# Patient Record
Sex: Female | Born: 1980 | Race: White | Hispanic: No | Marital: Married | Smoking: Never smoker
Health system: Southern US, Community
[De-identification: ages and names within clinical notes are randomized; demographics above are authoritative.]

## PROBLEM LIST (undated history)

## (undated) DIAGNOSIS — K219 Gastro-esophageal reflux disease without esophagitis: Secondary | ICD-10-CM

## (undated) DIAGNOSIS — I7300 Raynaud's syndrome without gangrene: Secondary | ICD-10-CM

## (undated) DIAGNOSIS — G901 Familial dysautonomia [Riley-Day]: Secondary | ICD-10-CM

## (undated) DIAGNOSIS — R9431 Abnormal electrocardiogram [ECG] [EKG]: Secondary | ICD-10-CM

## (undated) DIAGNOSIS — I493 Ventricular premature depolarization: Secondary | ICD-10-CM

## (undated) DIAGNOSIS — R000 Tachycardia, unspecified: Secondary | ICD-10-CM

## (undated) HISTORY — DX: Tachycardia, unspecified: R00.0

## (undated) HISTORY — DX: Gastro-esophageal reflux disease without esophagitis: K21.9

## (undated) HISTORY — DX: Ventricular premature depolarization: I49.3

## (undated) HISTORY — DX: Abnormal electrocardiogram (ECG) (EKG): R94.31

---
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
## 2004-03-28 ENCOUNTER — Other Ambulatory Visit: Payer: Self-pay

## 2005-05-10 ENCOUNTER — Ambulatory Visit: Payer: Self-pay | Admitting: Unknown Physician Specialty

## 2005-07-18 ENCOUNTER — Encounter: Payer: Self-pay | Admitting: Otolaryngology

## 2005-07-29 ENCOUNTER — Encounter: Payer: Self-pay | Admitting: Otolaryngology

## 2005-08-28 ENCOUNTER — Encounter: Payer: Self-pay | Admitting: Otolaryngology

## 2005-09-28 ENCOUNTER — Encounter: Payer: Self-pay | Admitting: Otolaryngology

## 2009-03-12 ENCOUNTER — Ambulatory Visit (HOSPITAL_BASED_OUTPATIENT_CLINIC_OR_DEPARTMENT_OTHER): Admission: RE | Admit: 2009-03-12 | Discharge: 2009-03-12 | Payer: Self-pay | Admitting: General Surgery

## 2009-03-12 ENCOUNTER — Encounter (INDEPENDENT_AMBULATORY_CARE_PROVIDER_SITE_OTHER): Payer: Self-pay | Admitting: General Surgery

## 2011-02-27 ENCOUNTER — Inpatient Hospital Stay (INDEPENDENT_AMBULATORY_CARE_PROVIDER_SITE_OTHER)
Admission: RE | Admit: 2011-02-27 | Discharge: 2011-02-27 | Disposition: A | Discharge status: Home / Self Care | Payer: 59 | Source: Ambulatory Visit | Attending: Family Medicine | Admitting: Family Medicine

## 2011-02-27 DIAGNOSIS — W260XXA Contact with knife, initial encounter: Secondary | ICD-10-CM

## 2011-02-27 DIAGNOSIS — S61409A Unspecified open wound of unspecified hand, initial encounter: Secondary | ICD-10-CM

## 2011-04-12 NOTE — Op Note (Signed)
NAMECatarina Fischer             ACCOUNT NO.:  [id number]   MEDICAL RECORD NO.:  [id number]          PATIENT TYPE:  AMB   LOCATION:  DSC                          FACILITY:  [facility]   PHYSICIAN:  Ollen Gross. Vernell Morgans, M.D. DATE OF BIRTH:  07-Nov-1981   DATE OF PROCEDURE:  03/12/2009  DATE OF DISCHARGE:                               OPERATIVE REPORT   PREOPERATIVE DIAGNOSIS:  Lipoma of the left mid back.   POSTOPERATIVE DIAGNOSIS:  Lipoma of the left mid back.   PROCEDURE:  Excision of lipoma from the left back.   SURGEON:  Ollen Gross. Vernell Morgans, MD   ANESTHESIA:  Local.   PROCEDURE IN DETAIL:  After informed consent was obtained, the patient  was brought to the operating room and placed in the prone position on  the operating room table.  Her left mid back area was prepped with  Betadine and draped in usual sterile manner.  The mass was palpable.  The area around the mass was infiltrated with 1% lidocaine until a good  field block was created.  A small transverse incision was made with a  #15 blade knife overlying the palpable mass.  This incision was carried  down through the skin and subcutaneous tissue sharply with the #15 blade  knife until the mass was identified.  The fatty mass was then separated  from the rest of the subcutaneous tissue by a combination of blunt  hemostat, dissection and sharp dissection with Metzenbaum scissors.  Once the mass was completely removed, it was sent to pathology for  further evaluation.  The wound was fairly hemostatic.  The wound was  then closed with interrupted 4-0 Monocryl subcuticular stitches and a  Dermabond dressing was applied.  The patient tolerated the procedure  well.  At the end of the case, all needle, sponge, and instrument counts  were correct.  The patient was then awakened and taken to recovery room  in stable condition.      Ollen Gross. Vernell Morgans, M.D.  Electronically Signed     PST/MEDQ  D:  03/13/2009  T:  03/14/2009  Job:   [id number]

## 2012-07-26 ENCOUNTER — Ambulatory Visit: Payer: 59 | Admitting: Cardiovascular Disease

## 2012-08-05 ENCOUNTER — Encounter (HOSPITAL_COMMUNITY): Payer: Self-pay | Admitting: Adult Health

## 2012-08-05 ENCOUNTER — Emergency Department (HOSPITAL_COMMUNITY)
Admission: EM | Admit: 2012-08-05 | Discharge: 2012-08-05 | Disposition: A | Discharge status: Home / Self Care | Payer: 59 | Attending: Emergency Medicine | Admitting: Emergency Medicine

## 2012-08-05 ENCOUNTER — Emergency Department (HOSPITAL_COMMUNITY): Payer: 59

## 2012-08-05 DIAGNOSIS — R000 Tachycardia, unspecified: Secondary | ICD-10-CM | POA: Insufficient documentation

## 2012-08-05 DIAGNOSIS — R002 Palpitations: Secondary | ICD-10-CM | POA: Insufficient documentation

## 2012-08-05 DIAGNOSIS — I4949 Other premature depolarization: Secondary | ICD-10-CM

## 2012-08-05 DIAGNOSIS — G901 Familial dysautonomia [Riley-Day]: Secondary | ICD-10-CM | POA: Diagnosis present

## 2012-08-05 DIAGNOSIS — R9431 Abnormal electrocardiogram [ECG] [EKG]: Secondary | ICD-10-CM

## 2012-08-05 DIAGNOSIS — R0789 Other chest pain: Secondary | ICD-10-CM | POA: Insufficient documentation

## 2012-08-05 DIAGNOSIS — I7300 Raynaud's syndrome without gangrene: Secondary | ICD-10-CM | POA: Insufficient documentation

## 2012-08-05 DIAGNOSIS — I493 Ventricular premature depolarization: Secondary | ICD-10-CM | POA: Diagnosis present

## 2012-08-05 DIAGNOSIS — R0602 Shortness of breath: Secondary | ICD-10-CM | POA: Insufficient documentation

## 2012-08-05 DIAGNOSIS — Z79899 Other long term (current) drug therapy: Secondary | ICD-10-CM | POA: Insufficient documentation

## 2012-08-05 HISTORY — DX: Raynaud's syndrome without gangrene: I73.00

## 2012-08-05 LAB — POCT I-STAT, CHEM 8
BUN: 11 mg/dL (ref 6–23)
Chloride: 104 mEq/L (ref 96–112)
Potassium: 3.5 mEq/L (ref 3.5–5.1)
Sodium: 140 mEq/L (ref 135–145)
TCO2: 23 mmol/L (ref 0–100)

## 2012-08-05 LAB — CBC WITH DIFFERENTIAL/PLATELET
Eosinophils Absolute: 0.1 10*3/uL (ref 0.0–0.7)
Hemoglobin: 12.9 g/dL (ref 12.0–15.0)
Lymphocytes Relative: 25 % (ref 12–46)
Lymphs Abs: 2.5 10*3/uL (ref 0.7–4.0)
Neutro Abs: 6.7 10*3/uL (ref 1.7–7.7)
Neutrophils Relative %: 67 % (ref 43–77)
Platelets: 273 10*3/uL (ref 150–400)
RBC: 4.37 MIL/uL (ref 3.87–5.11)
WBC: 10 10*3/uL (ref 4.0–10.5)

## 2012-08-05 LAB — D-DIMER, QUANTITATIVE: D-Dimer, Quant: <0.22 ug/mL-FEU (ref 0.00–0.48)

## 2012-08-05 LAB — POCT PREGNANCY, URINE: Preg Test, Ur: NEGATIVE

## 2012-08-05 LAB — POCT I-STAT TROPONIN I
Troponin i, poc: 0 ng/mL (ref 0.00–0.08)
Troponin i, poc: 0 ng/mL (ref 0.00–0.08)

## 2012-08-05 LAB — PREGNANCY, URINE: Preg Test, Ur: NEGATIVE

## 2012-08-05 MED ORDER — METOPROLOL SUCCINATE ER 50 MG PO TB24: 50.0000 mg | ORAL_TABLET | Freq: Every day | ORAL | Status: DC

## 2012-08-05 MED ORDER — ASPIRIN 81 MG PO CHEW
324.0000 mg | CHEWABLE_TABLET | Freq: Once | ORAL | Status: AC
  Administered 2012-08-05: 324 mg via ORAL
  Filled 2012-08-05: qty 4

## 2012-08-05 MED ORDER — SODIUM CHLORIDE 0.9 % IV BOLUS (SEPSIS)
1000.0000 mL | Freq: Once | INTRAVENOUS | Status: AC
  Administered 2012-08-05: 1000 mL via INTRAVENOUS

## 2012-08-05 NOTE — ED Notes (Signed)
No needs at this time, consulting provider at the bedside.

## 2012-08-05 NOTE — Consult Note (Signed)
CARDIOLOGY CONSULT NOTE   Patient ID: Kelly Fischer MRN: [id number] DOB/AGE: [birth year] 31 y.o.  Admit date: 08/05/2012  Primary Physician   Sherron Monday, MD Primary Cardiologist   None  Reason for Consultation   Palpitations   [id number] Kelly Fischer is a 31 y.o. female with no history of CAD.  She has a long history of palpitations, but frequency and intensity of increased in recent weeks and she has experienced 2 spells of sustained tachycardia, one lasting 10 minutes with a heart rate of 170-190. Generally, her heart will skip a beat and she will notice it but otherwise been asymptomatic. During tachycardia,  palpitations were too fast for her to count and start/stop suddenly. She has never worn a monitor to assess these. Recently, the heart "skips" have been more frequent. Sometimes she can feel her heart doing this every other beat. When they are this frequent, she will feel dizzy and a little weak and experienced mild dyspnea. She has no history of syncope or fall. She does not characterize herself as generally anxious, but anxiety has increased related to her arrhythmia. She drinks one cup of coffee a day and exercises regularly. She does not use caffeine in any other source and does not use nicotine. She does not take over-the-counter medications for weight loss.  Last p.m., the symptoms were the worst they have been. She also developed some chest pain. She felt anxious about it and decided to come to the emergency room. In the emergency room, her ECG was read as abnormal and she was having frequent PVCs. She is not currently in any significant discomfort but his area where of the PVCs.  Past Medical History  Diagnosis Date  . Raynaud's disease     History reviewed. No pertinent past surgical history.  Allergies  Allergen Reactions  . Zithromax (Azithromycin) Rash   I have reviewed the patient's current medications  Medication Sig  B Complex-C (B-COMPLEX WITH VITAMIN C) tablet Take 1  tablet by mouth daily.  Calcium Carbonate-Vitamin D (CALCIUM + D PO) Take 1 tablet by mouth daily.  drospirenone-ethinyl estradiol (YAZ) 3-0.02 MG tablet Take 1 tablet by mouth daily.   History   Social History  . Marital Status: Married    Spouse Name: N/A    Number of Children: N/A  . Years of Education: N/A   Occupational History  . RN [employer]   Social History Main Topics  . Smoking status: Never Smoker   . Smokeless tobacco: Never Used  . Alcohol Use: Yes  . Drug Use: No  . Sexually Active: Not on file   Other Topics Concern  . Not on file   Social History Narrative   There is no history of premature coronary artery disease in her parents or siblings. No first-degree relatives have pacemakers or other devices.   ROS: She is generally healthy and exercises regularly. She has no ongoing medical concerns except as listed above.  Full 14 point review of systems complete and found to be negative unless listed above.  Physical Exam: Blood pressure 121/83, pulse 103, temperature 98.1 F (36.7 C), temperature source Oral, resp. rate 20, last menstrual period 07/22/2012, SpO2 100.00%.  General: Well developed, well nourished, female in no acute distress Head: Eyes PERRLA, No xanthomas.   Normocephalic and atraumatic, oropharynx without edema or exudate. Dentition - good Lungs: Clear bilaterally Heart:  Heart slightly irregular rate and rhythm with S1, S2 , soft systolic murmur. pulses are 2+ all 4 extrem.  Neck: No carotid bruits. No lymphadenopathy.  JVD not elevated. Abdomen: Bowel sounds present, abdomen soft and non-tender without masses or hernias noted. Msk:  No spine or cva tenderness. No weakness, no joint deformities or effusions. Extremities: No clubbing or cyanosis. No  edema.  Neuro: Alert and oriented X 3. No focal deficits noted. Psych:  Good affect, responds appropriately Skin: No rashes or lesions noted.  Labs:   Lab Results  Component Value Date   WBC  10.0 08/05/2012   HGB 13.6 08/05/2012   HCT 40.0 08/05/2012   MCV 87.0 08/05/2012   PLT 273 08/05/2012     Lab 08/05/12 0404  NA 140  K 3.5  CL 104  CO2 --  BUN 11  CREATININE 0.90  CALCIUM --  PROT --  BILITOT --  ALKPHOS --  ALT --  AST --  GLUCOSE 114*    Basename 08/05/12 0403  TROPIPOC 0.00   Lab Results  Component Value Date   DDIMER <0.22 08/05/2012   Echo: Within normal limits   ECG: Sinus tachycardia with PVCs; nondiagnostic Q waves; prominent QRS voltage; ST-T wave abnormalities consistent with inferolateral ischemia or LVH; short PR interval; normal QTC; mild QRS widening without definite preexcitation.  Radiology:  Dg Chest 2 View-normal  ASSESSMENT AND PLAN:   The patient was seen today by Dr. Dietrich Pates, who  evaluated the patient and reviewed all available data.   Ventricular ectopy - she has an appointment to see Dr. Eden Emms tomorrow. Upon telemetry review, she is having frequent PVCs and occasional bigeminy, but no more serious arrhythmias.   M.D. advised on adding a beta blocker to her regimen and following up with Dr. Eden Emms.  Abnormal EKG-short PR may indicate abnormal AV node conduction and suggests the possibility of SVT accounting for her sustained tachycardias.  If she does in fact have PSVT, beta blocker may suppress that as well as her PVCs.  Athletic heart is a consideration, but it does not sound as if she is exercising at the level typically associated with EKG abnormalities. TSH is pending.  Plan: Discussed with Dr. Fonnie Jarvis who will prescribe Toprol 50 mg per day and discharge patient to home. Cardiac issues discussed with patient and her husband. I reassured them that none of her issues appear to be particularly threatening, discussed the potential side effects of metoprolol and advised having the dose should she experience these. She will followup with Dr. Eden Emms and our electrophysiology service as necessary.  [name] 08/05/2012, 7:33  AM   Cardiology Attending Patient interviewed and examined. Discussed with Theodore Demark, PA-C.  Above note annotated and modified based upon my findings.  Allison Bing, MD 08/05/2012, 12:31 PM     \

## 2012-08-05 NOTE — ED Notes (Signed)
Presents with one week of palpitations, SOB and chest discomfort. Pt states, "I can feel my heart dropping a beat" HR ranging from 130-98

## 2012-08-05 NOTE — ED Notes (Signed)
Resting quietly vital signs stable. NAD noted water given after verified with Dr. Fonnie Jarvis

## 2012-08-05 NOTE — ED Notes (Signed)
Waiting for cardiology consult

## 2012-08-05 NOTE — Progress Notes (Signed)
2D Echo performed  08/05/2012

## 2012-08-05 NOTE — ED Notes (Signed)
Patient discharged with her husband NAD noted.

## 2012-08-05 NOTE — ED Provider Notes (Signed)
History     CSN: [id number]  Arrival date & time 08/05/12  7829   First MD Initiated Contact with Patient 08/05/12 0403      Chief Complaint  Patient presents with  . Palpitations    (Consider location/radiation/quality/duration/timing/severity/associated sxs/prior treatment) Patient is a 31 y.o. female presenting with palpitations. The history is provided by the patient. No language interpreter was used.  Palpitations  This is a recurrent problem. The current episode started more than 1 week ago. The problem occurs daily. The problem has not changed since onset.Associated with: nothing. Episode Length: seconds to minutes. Associated symptoms include chest pressure and shortness of breath. Pertinent negatives include no diaphoresis, no fever, no malaise/fatigue, no numbness, no chest pain, no near-syncope, no orthopnea, no PND, no syncope, no abdominal pain, no vomiting, no headaches, no weakness, no cough and no hemoptysis.    Past Medical History  Diagnosis Date  . Raynaud's disease     History reviewed. No pertinent past surgical history.  History reviewed. No pertinent family history.  History  Substance Use Topics  . Smoking status: Never Smoker   . Smokeless tobacco: Not on file  . Alcohol Use: Yes    OB History    Grav Para Term Preterm Abortions TAB SAB Ect Mult Living                  Review of Systems  Constitutional: Negative for fever, malaise/fatigue and diaphoresis.  Respiratory: Positive for chest tightness and shortness of breath. Negative for cough and hemoptysis.   Cardiovascular: Positive for palpitations. Negative for chest pain, orthopnea, leg swelling, syncope, PND and near-syncope.  Gastrointestinal: Negative for vomiting and abdominal pain.  Neurological: Negative for weakness, numbness and headaches.  All other systems reviewed and are negative.    Allergies  Zithromax  Home Medications   Current Outpatient Rx  Name Route Sig Dispense  Refill  . B COMPLEX-C PO TABS Oral Take 1 tablet by mouth daily.    Marland Kitchen CALCIUM + D PO Oral Take 1 tablet by mouth daily.    . DROSPIRENONE-ETHINYL ESTRADIOL 3-0.02 MG PO TABS Oral Take 1 tablet by mouth daily.      BP 152/80  Pulse 106  Resp 16  SpO2 100%  LMP 07/22/2012  Physical Exam  Constitutional: She is oriented to person, place, and time. She appears well-developed and well-nourished. No distress.  HENT:  Head: Normocephalic and atraumatic.  Mouth/Throat: Oropharynx is clear and moist.  Eyes: Conjunctivae are normal. Pupils are equal, round, and reactive to light.  Neck: Normal range of motion. Neck supple.  Cardiovascular: Intact distal pulses.  An irregular rhythm present. Tachycardia present.   Pulmonary/Chest: Effort normal and breath sounds normal. She has no wheezes. She has no rales.  Abdominal: Soft. Bowel sounds are normal. There is no tenderness. There is no rebound and no guarding.  Musculoskeletal: Normal range of motion.  Neurological: She is alert and oriented to person, place, and time.  Skin: Skin is warm and dry.  Psychiatric: She has a normal mood and affect.    ED Course  Procedures (including critical care time)  Labs Reviewed  POCT I-STAT, CHEM 8 - Abnormal; Notable for the following:    Glucose, Bld 114 (*)     All other components within normal limits  CBC WITH DIFFERENTIAL  D-DIMER, QUANTITATIVE  POCT I-STAT TROPONIN I  POCT PREGNANCY, URINE  TSH   Dg Chest 2 View  08/05/2012  *RADIOLOGY REPORT*  Clinical Data:  Heart palpitations.  Shortness of breath.  CHEST - 2 VIEW  Comparison: None.  Findings: The heart size and pulmonary vascularity are normal. The lungs appear clear and expanded without focal air space disease or consolidation. No blunting of the costophrenic angles.  No pneumothorax.  Mediastinal contours appear intact.  IMPRESSION: No evidence of active pulmonary disease.   Original Report Authenticated By: Marlon Pel, M.D.       No diagnosis found.    MDM   Date: 08/05/2012  Rate: 117  Rhythm: sinus tachycardia  QRS Axis: normal  Intervals: normal  ST/T Wave abnormalities: nonspecific ST/T changes  Conduction Disutrbances:none  Narrative Interpretation: LVH l  Old EKG Reviewed: none available     Case d/w Dr. Mayford Knife call [place] at 7 am to be seen echo cycle enzymes     Oneita Allmon K Kevork Joyce-Rasch, MD 08/05/12 [phone]

## 2012-08-06 ENCOUNTER — Encounter: Payer: Self-pay | Admitting: Cardiovascular Disease

## 2012-08-06 ENCOUNTER — Encounter: Payer: Self-pay | Admitting: *Deleted

## 2012-08-06 ENCOUNTER — Ambulatory Visit: Payer: 59 | Admitting: Cardiovascular Disease

## 2012-08-06 ENCOUNTER — Telehealth: Payer: Self-pay | Admitting: *Deleted

## 2012-08-06 DIAGNOSIS — R002 Palpitations: Secondary | ICD-10-CM

## 2012-08-06 NOTE — Telephone Encounter (Signed)
SPOKE WITH PT PER DR NISHAN  PT NEEDS  EVENT MONITOR AND EP FOLLOW UP  ORDER ENTERED FOR MONITOR AND  APPT MADE WITH DR ALLRED  09-03-12 AT  11:30 AM .Kelly Fischer

## 2012-08-06 NOTE — Telephone Encounter (Signed)
Patient scheduled for monitor 08/07/12 @ 2 PM.

## 2012-08-07 ENCOUNTER — Encounter (INDEPENDENT_AMBULATORY_CARE_PROVIDER_SITE_OTHER): Payer: 59

## 2012-08-07 DIAGNOSIS — R002 Palpitations: Secondary | ICD-10-CM

## 2012-09-03 ENCOUNTER — Ambulatory Visit (INDEPENDENT_AMBULATORY_CARE_PROVIDER_SITE_OTHER): Payer: 59 | Admitting: Internal Medicine

## 2012-09-03 ENCOUNTER — Encounter: Payer: Self-pay | Admitting: Internal Medicine

## 2012-09-03 VITALS — BP 116/80 | HR 87 | Ht 65.0 in | Wt 117.8 lb

## 2012-09-03 DIAGNOSIS — I4949 Other premature depolarization: Secondary | ICD-10-CM

## 2012-09-03 DIAGNOSIS — I493 Ventricular premature depolarization: Secondary | ICD-10-CM

## 2012-09-03 DIAGNOSIS — R002 Palpitations: Secondary | ICD-10-CM

## 2012-09-03 MED ORDER — DILTIAZEM HCL 30 MG PO TABS: ORAL_TABLET | ORAL | Status: DC

## 2012-09-03 NOTE — Patient Instructions (Addendum)
Your physician recommends that you schedule a follow-up appointment as needed   Your physician has recommended you make the following change in your medication:  1) Take Cardizem 30mg  every 6 hours as needed  Call Tresa Endo if needed [phone]

## 2012-09-24 NOTE — Assessment & Plan Note (Signed)
She has occasional PVCs which are not closely coupled to the proceeding t wave In the absence of structural heart disease, no further workup is planned. She would like to avoid beta blockers if possible.

## 2012-09-24 NOTE — Progress Notes (Signed)
   Primary Care Physician: Sherron Monday, MD Referring Physician:  Dr Melissa Montane is a 31 y.o. female with a h/o recent PVCs and palpitations who presents for EP consultation.  She recently presented to [facility] ER with symptoms of tachypalpitations.  She reports that she had been to the beach several days before and had drank more ETOH than is typical for her.  She has experienced similar palpitations intermittently in the past.  She denies associated symptoms of chest pain, shortness of breath, orthopnea, PND, lower extremity edema, dizziness, presyncope, syncope, or neurologic sequela. Since being seen in the ER, her symptoms have resolved.    Past Medical History  Diagnosis Date  . Raynaud's disease   . PVC (premature ventricular contraction)   . Abnormal EKG     Prominent QRS voltage, short PR interval, ST-T wave abnormalities.    Past Surgical History  Procedure Date  . Wisdom tooth extraction 2002    Current Outpatient Prescriptions  Medication Sig Dispense Refill  . B Complex-C (B-COMPLEX WITH VITAMIN C) tablet Take 1 tablet by mouth daily.      . Calcium Carbonate-Vitamin D (CALCIUM + D PO) Take 1 tablet by mouth daily.      . drospirenone-ethinyl estradiol (YAZ,GIANVI,LORYNA) 3-0.02 MG tablet Take 1 tablet by mouth daily.      Marland Kitchen diltiazem (CARDIZEM) 30 MG tablet Take 1 every 6 hours as needed for palpitations  30 tablet  1    Allergies  Allergen Reactions  . Zithromax (Azithromycin) Rash    History   Social History  . Marital Status: Married    Spouse Name: N/A    Number of Children: N/A  . Years of Education: N/A   Occupational History  . RN [employer]   Social History Main Topics  . Smoking status: Never Smoker   . Smokeless tobacco: Never Used  . Alcohol Use: Yes  . Drug Use: No  . Sexually Active: Not on file   Other Topics Concern  . Not on file   Social History Narrative   There is no history of premature coronary artery disease in  her parents or siblings. No first-degree relatives have pacemakers or other devices.    History reviewed. No pertinent family history.  ROS- All systems are reviewed and negative except as per the HPI above  Physical Exam: Filed Vitals:   09/03/12 1157  BP: 116/80  Pulse: 87  Height: 5\' 5"  (1.651 m)  Weight: 117 lb 12.8 oz (53.434 kg)  SpO2: 99%    GEN- The patient is well appearing, alert and oriented x 3 today.   Head- normocephalic, atraumatic Eyes-  Sclera clear, conjunctiva pink Ears- hearing intact Oropharynx- clear Neck- supple, no JVP Lymph- no cervical lymphadenopathy Lungs- Clear to ausculation bilaterally, normal work of breathing Heart- Regular rate and rhythm, no murmurs, rubs or gallops, PMI not laterally displaced GI- soft, NT, ND, + BS Extremities- no clubbing, cyanosis, or edema MS- no significant deformity or atrophy Skin- no rash or lesion Psych- euthymic mood, full affect Neuro- strength and sensation are intact  EKG today reveals sinus rhythm 96 bpm, PR 122 without obvious pre-excitation, nonspecific ST/ T chnages Event monitor 08/07/12 is reviewed sinus rhythm with frequent sinus tach, occasional PVCs, no VT Echo reviewed  Assessment and Plan:

## 2012-09-24 NOTE — Assessment & Plan Note (Addendum)
Pt with recent ER visit for tachypalpitations.  She has a short PR interval but without clear pre-excitation.  I have reviewed her EKGs as well as her event monitor in detail.  She has documented sinus tachycardia, but no SVT.  Her recent symptoms lead to the ER visit were proceeded by vacation at the beach with increased ETOH consumption.  Lifestyle modification including adequate hydration and avoidance of ETOH are encouraged. Her symptoms have not resolved. I will give short acting cardizem 30mg  q6 hours to take as needed for tachpalpitations. In the absence of documented SVT or progressive symptoms, I would not recommend EP study at this time. She will therefore continue current strategy and contact my office if worsening symptoms arise.

## 2012-12-14 NOTE — Addendum Note (Signed)
Addended by: Reine Just on: 12/14/2012 02:37 PM   Modules accepted: Orders

## 2013-01-05 ENCOUNTER — Encounter (HOSPITAL_COMMUNITY): Payer: Self-pay | Admitting: *Deleted

## 2013-01-05 ENCOUNTER — Other Ambulatory Visit: Payer: Self-pay

## 2013-01-05 ENCOUNTER — Emergency Department (HOSPITAL_COMMUNITY): Payer: 59

## 2013-01-05 ENCOUNTER — Emergency Department (HOSPITAL_COMMUNITY)
Admission: EM | Admit: 2013-01-05 | Discharge: 2013-01-05 | Disposition: A | Discharge status: Home Health | Payer: 59 | Attending: Emergency Medicine | Admitting: Emergency Medicine

## 2013-01-05 DIAGNOSIS — R002 Palpitations: Secondary | ICD-10-CM | POA: Insufficient documentation

## 2013-01-05 DIAGNOSIS — Z8679 Personal history of other diseases of the circulatory system: Secondary | ICD-10-CM | POA: Insufficient documentation

## 2013-01-05 DIAGNOSIS — Z79899 Other long term (current) drug therapy: Secondary | ICD-10-CM | POA: Insufficient documentation

## 2013-01-05 LAB — CBC WITH DIFFERENTIAL/PLATELET
Basophils Absolute: 0 10*3/uL (ref 0.0–0.1)
Eosinophils Absolute: 0.1 10*3/uL (ref 0.0–0.7)
Eosinophils Relative: 1 % (ref 0–5)
Lymphocytes Relative: 40 % (ref 12–46)
MCV: 86 fL (ref 78.0–100.0)
Neutrophils Relative %: 52 % (ref 43–77)
Platelets: 319 10*3/uL (ref 150–400)
RDW: 12.6 % (ref 11.5–15.5)
WBC: 7.8 10*3/uL (ref 4.0–10.5)

## 2013-01-05 LAB — COMPREHENSIVE METABOLIC PANEL
ALT: 14 U/L (ref 0–35)
AST: 22 U/L (ref 0–37)
CO2: 25 mEq/L (ref 19–32)
Calcium: 9.7 mg/dL (ref 8.4–10.5)
Potassium: 3.8 mEq/L (ref 3.5–5.1)
Sodium: 133 mEq/L — ABNORMAL LOW (ref 135–145)
Total Protein: 7.7 g/dL (ref 6.0–8.3)

## 2013-01-05 LAB — TROPONIN I: Troponin I: <0.3 ng/mL (ref <0.30)

## 2013-01-05 MED ORDER — ALPRAZOLAM 0.25 MG PO TABS: 0.2500 mg | ORAL_TABLET | Freq: Every evening | ORAL | Status: DC | PRN

## 2013-01-05 MED ORDER — SODIUM CHLORIDE 0.9 % IV BOLUS (SEPSIS)
1000.0000 mL | Freq: Once | INTRAVENOUS | Status: AC
  Administered 2013-01-05: 1000 mL via INTRAVENOUS

## 2013-01-05 NOTE — ED Notes (Signed)
Patient reports for the past 2 days she has had shortness of breath, palpitations with heartrate up to 160.  Patient states she is feeling tight in her chest.  Patient states she feels like she is going to get sick every time she tries to eat.  Patient states she feels anxious but has no hx of anxiety.  Patient also has elevated bp which is abnormal for patient

## 2013-01-05 NOTE — ED Notes (Signed)
DR.Yelverton given copy of ecg and old ecg.

## 2013-01-05 NOTE — ED Provider Notes (Signed)
History     CSN: [id number]  Arrival date & time 01/05/13  1730   First MD Initiated Contact with Patient 01/05/13 1732      Chief Complaint  Patient presents with  . Shortness of Breath  . Palpitations    (Consider location/radiation/quality/duration/timing/severity/associated sxs/prior treatment) HPI Pt with several month history of intermittent palpitations has had increased for the past 3 weeks associated with SOB. OVer the past 2 days has had chest pressure and tachycardia. She has also noticed increased SBP in 150-160. She has had decreased appetite, slight weight loss. No lower ext swelling or pain. No recent surgery of travel. No increased stress. Workup in the past has been negative including thyroid and cardiac workup. Pt take oral BC pills and has regular periods.  Past Medical History  Diagnosis Date  . Raynaud's disease   . PVC (premature ventricular contraction)   . Abnormal EKG     Prominent QRS voltage, short PR interval, ST-T wave abnormalities.     Past Surgical History  Procedure Laterality Date  . Wisdom tooth extraction  2002    No family history on file.  History  Substance Use Topics  . Smoking status: Never Smoker   . Smokeless tobacco: Never Used  . Alcohol Use: Yes    OB History   Grav Para Term Preterm Abortions TAB SAB Ect Mult Living                  Review of Systems  Constitutional: Positive for appetite change, fatigue and unexpected weight change. Negative for fever and chills.  HENT: Negative for neck pain and neck stiffness.   Eyes: Negative for visual disturbance.  Respiratory: Positive for chest tightness and shortness of breath. Negative for wheezing.   Cardiovascular: Positive for palpitations. Negative for chest pain and leg swelling.  Gastrointestinal: Negative for abdominal pain.  Genitourinary: Negative for vaginal bleeding and menstrual problem.  Musculoskeletal: Negative for myalgias.  Skin: Negative for rash and  wound.  Neurological: Negative for syncope, numbness and headaches.  Psychiatric/Behavioral: The patient is nervous/anxious.   All other systems reviewed and are negative.    Allergies  Zithromax  Home Medications   Current Outpatient Rx  Name  Route  Sig  Dispense  Refill  . B Complex-C (B-COMPLEX WITH VITAMIN C) tablet   Oral   Take 1 tablet by mouth daily.         . Calcium Carbonate-Vitamin D (CALCIUM + D PO)   Oral   Take 1 tablet by mouth daily.         Marland Kitchen diltiazem (CARDIZEM) 30 MG tablet   Oral   Take 30 mg by mouth every 6 (six) hours as needed (for palpitations). Take 1 every 6 hours as needed for palpitations         . drospirenone-ethinyl estradiol (YAZ,GIANVI,LORYNA) 3-0.02 MG tablet   Oral   Take 1 tablet by mouth daily.         Marland Kitchen ALPRAZolam (XANAX) 0.25 MG tablet   Oral   Take 1 tablet (0.25 mg total) by mouth at bedtime as needed for sleep.   30 tablet   0     BP 144/95  Pulse 96  Temp(Src) 98.8 F (37.1 C) (Oral)  Resp 17  Ht 5\' 5"  (1.651 m)  Wt 118 lb (53.524 kg)  BMI 19.64 kg/m2  SpO2 100%  LMP 12/29/2012  Physical Exam  Nursing note and vitals reviewed. Constitutional: She is oriented to person, place,  and time. She appears well-developed and well-nourished. No distress.  HENT:  Head: Normocephalic and atraumatic.  Mouth/Throat: Oropharynx is clear and moist.  Eyes: EOM are normal. Pupils are equal, round, and reactive to light.  Neck: Normal range of motion. Neck supple. No thyromegaly present.  Cardiovascular: Regular rhythm.   Mild tachycardia  Pulmonary/Chest: Effort normal and breath sounds normal. No respiratory distress. She has no wheezes. She has no rales. She exhibits no tenderness.  Abdominal: Soft. Bowel sounds are normal. She exhibits no mass. There is no tenderness. There is no rebound and no guarding.  Musculoskeletal: Normal range of motion. She exhibits no edema and no tenderness.  No calf swelling or tenderness   Neurological: She is alert and oriented to person, place, and time.  Moves all ext without deficit, normal ambulation  Skin: Skin is warm and dry. No rash noted. No erythema.  Psychiatric:  Mildly anxious    ED Course  Procedures (including critical care time)  Labs Reviewed  COMPREHENSIVE METABOLIC PANEL - Abnormal; Notable for the following:    Sodium 133 (*)    Glucose, Bld 128 (*)    Alkaline Phosphatase 38 (*)    Total Bilirubin 0.2 (*)    All other components within normal limits  CBC WITH DIFFERENTIAL  PREGNANCY, URINE  D-DIMER, QUANTITATIVE  TROPONIN I  URINALYSIS, ROUTINE W REFLEX MICROSCOPIC   Dg Chest 2 View  01/05/2013  *RADIOLOGY REPORT*  Clinical Data: Short of breath  CHEST - 2 VIEW  Comparison: 08/05/2012  Findings: Lungs are clear without infiltrate or effusion. Vascularity normal and heart size is normal.  Mild scoliosis.  IMPRESSION: No acute abnormality and no interval change.   Original Report Authenticated By: Janeece Riggers, M.D.      1. Heart palpitations      Date: 01/05/2013  Rate: 107  Rhythm: sinus tachycardia  QRS Axis: normal  Intervals: normal  ST/T Wave abnormalities: nonspecific T wave changes  Conduction Disutrbances:none  Narrative Interpretation:   Old EKG Reviewed: unchanged    MDM  Discussed with [place] cardiology. Suggested increased use of Cardizem and agree with low dose benzo. Pt advised to f/u with Dr Johney Frame and establish PMD for further workup.         Loren Racer, MD 01/05/13 2035

## 2013-01-12 ENCOUNTER — Other Ambulatory Visit: Payer: Self-pay

## 2013-01-15 ENCOUNTER — Emergency Department (HOSPITAL_COMMUNITY)
Admission: EM | Admit: 2013-01-15 | Discharge: 2013-01-15 | Disposition: A | Discharge status: Home / Self Care | Payer: 59 | Attending: Emergency Medicine | Admitting: Emergency Medicine

## 2013-01-15 ENCOUNTER — Encounter (HOSPITAL_COMMUNITY): Payer: Self-pay | Admitting: Cardiology

## 2013-01-15 ENCOUNTER — Emergency Department (HOSPITAL_COMMUNITY): Payer: 59

## 2013-01-15 DIAGNOSIS — R5383 Other fatigue: Secondary | ICD-10-CM | POA: Insufficient documentation

## 2013-01-15 DIAGNOSIS — R002 Palpitations: Secondary | ICD-10-CM | POA: Insufficient documentation

## 2013-01-15 DIAGNOSIS — Z79899 Other long term (current) drug therapy: Secondary | ICD-10-CM | POA: Insufficient documentation

## 2013-01-15 DIAGNOSIS — Z8679 Personal history of other diseases of the circulatory system: Secondary | ICD-10-CM | POA: Insufficient documentation

## 2013-01-15 DIAGNOSIS — R079 Chest pain, unspecified: Secondary | ICD-10-CM | POA: Insufficient documentation

## 2013-01-15 DIAGNOSIS — R5381 Other malaise: Secondary | ICD-10-CM | POA: Insufficient documentation

## 2013-01-15 LAB — BASIC METABOLIC PANEL
BUN: 6 mg/dL (ref 6–23)
CO2: 26 mEq/L (ref 19–32)
Chloride: 98 mEq/L (ref 96–112)
Glucose, Bld: 96 mg/dL (ref 70–99)
Potassium: 3.4 mEq/L — ABNORMAL LOW (ref 3.5–5.1)

## 2013-01-15 LAB — CBC
HCT: 40.5 % (ref 36.0–46.0)
Hemoglobin: 14.1 g/dL (ref 12.0–15.0)
WBC: 10.2 10*3/uL (ref 4.0–10.5)

## 2013-01-15 MED ORDER — IOHEXOL 350 MG/ML SOLN
60.0000 mL | Freq: Once | INTRAVENOUS | Status: AC | PRN
  Administered 2013-01-15: 85 mL via INTRAVENOUS

## 2013-01-15 NOTE — ED Provider Notes (Signed)
History     CSN: [id number]  Arrival date & time 01/15/13  1714   First MD Initiated Contact with Patient 01/15/13 1916      Chief Complaint  Patient presents with  . Shortness of Breath    (Consider location/radiation/quality/duration/timing/severity/associated sxs/prior treatment) Patient is a 32 y.o. female presenting with palpitations. The history is provided by the patient. No language interpreter was used.  Palpitations  This is a recurrent problem. The current episode started more than 1 week ago. The problem occurs daily. The problem has been gradually worsening. The problem is associated with an unknown factor. Associated symptoms include malaise/fatigue, chest pressure and shortness of breath. Pertinent negatives include no diaphoresis, no fever, no numbness, no chest pain, no syncope, no abdominal pain, no nausea, no vomiting, no headaches, no back pain, no leg pain, no lower extremity edema, no weakness and no cough. She has tried nothing for the symptoms. The treatment provided no relief. Risk factors: recent Yaz use. Her past medical history does not include anemia, heart disease or hyperthyroidism.    Past Medical History  Diagnosis Date  . Raynaud's disease   . PVC (premature ventricular contraction)   . Abnormal EKG     Prominent QRS voltage, short PR interval, ST-T wave abnormalities.     Past Surgical History  Procedure Laterality Date  . Wisdom tooth extraction  2002    History reviewed. No pertinent family history.  History  Substance Use Topics  . Smoking status: Never Smoker   . Smokeless tobacco: Never Used  . Alcohol Use: Yes    OB History   Grav Para Term Preterm Abortions TAB SAB Ect Mult Living                  Review of Systems  Constitutional: Positive for malaise/fatigue. Negative for fever, chills, diaphoresis, activity change, appetite change and fatigue.  HENT: Negative for congestion, sore throat, facial swelling, rhinorrhea, neck  pain and neck stiffness.   Eyes: Negative for photophobia and discharge.  Respiratory: Positive for shortness of breath. Negative for cough and chest tightness.   Cardiovascular: Positive for palpitations. Negative for chest pain, leg swelling and syncope.  Gastrointestinal: Negative for nausea, vomiting, abdominal pain and diarrhea.  Endocrine: Negative for polydipsia and polyuria.  Genitourinary: Negative for dysuria, frequency, difficulty urinating and pelvic pain.  Musculoskeletal: Negative for back pain and arthralgias.  Skin: Negative for color change and wound.  Allergic/Immunologic: Negative for immunocompromised state.  Neurological: Negative for facial asymmetry, weakness, numbness and headaches.  Hematological: Does not bruise/bleed easily.  Psychiatric/Behavioral: Negative for confusion and agitation.    Allergies  Zithromax  Home Medications   Current Outpatient Rx  Name  Route  Sig  Dispense  Refill  . ALPRAZolam (XANAX) 0.25 MG tablet   Oral   Take 1 tablet (0.25 mg total) by mouth at bedtime as needed for sleep.   30 tablet   0   . B Complex-C (B-COMPLEX WITH VITAMIN C) tablet   Oral   Take 1 tablet by mouth daily.         . Calcium Carbonate-Vitamin D (CALCIUM + D PO)   Oral   Take 1 tablet by mouth daily.         Marland Kitchen ibuprofen (ADVIL,MOTRIN) 200 MG tablet   Oral   Take 600 mg by mouth daily as needed for pain.         . Levonorgestrel (SKYLA) 13.5 MG IUD   Intrauterine  by Intrauterine route. Once every 3 years - implanted 01/08/2013         . diltiazem (CARDIZEM) 30 MG tablet   Oral   Take 30 mg by mouth every 6 (six) hours as needed (for palpitations).            BP 130/72  Pulse 101  Temp(Src) 98.3 F (36.8 C) (Oral)  Resp 14  SpO2 99%  LMP 12/29/2012  Physical Exam  Constitutional: She is oriented to person, place, and time. She appears well-developed and well-nourished. No distress.  HENT:  Head: Normocephalic and atraumatic.   Mouth/Throat: No oropharyngeal exudate.  Eyes: Pupils are equal, round, and reactive to light.  Neck: Normal range of motion. Neck supple.  Cardiovascular: Regular rhythm and normal heart sounds.  Tachycardia present.  Exam reveals no gallop and no friction rub.   No murmur heard. Pulmonary/Chest: Effort normal and breath sounds normal. No respiratory distress. She has no wheezes. She has no rales.  Abdominal: Soft. Bowel sounds are normal. She exhibits no distension and no mass. There is no tenderness. There is no rebound and no guarding.  Musculoskeletal: Normal range of motion. She exhibits no edema and no tenderness.  Neurological: She is alert and oriented to person, place, and time.  Skin: Skin is warm and dry.  Psychiatric: She has a normal mood and affect.    ED Course  Procedures (including critical care time)  Labs Reviewed  BASIC METABOLIC PANEL - Abnormal; Notable for the following:    Sodium 134 (*)    Potassium 3.4 (*)    All other components within normal limits  CBC   Ct Angio Chest Pe W/cm &/or Wo Cm  01/15/2013  *RADIOLOGY REPORT*  Clinical Data: Chest pain.  Short of breath.  Tachycardia.  CT ANGIOGRAPHY CHEST  Technique:  Multidetector CT imaging of the chest using the standard protocol during bolus administration of intravenous contrast. Multiplanar reconstructed images including MIPs were obtained and reviewed to evaluate the vascular anatomy.  Contrast: 85mL OMNIPAQUE IOHEXOL 350 MG/ML SOLN  Comparison: None.  Findings: There are no filling defects in the pulmonary arterial tree to suggest acute pulmonary thromboembolism.  No evidence of aortic dissection or aneurysm.  No pericardial effusion.  No pleural effusion.  No pneumothorax.  Clear lungs.  No abnormal mediastinal adenopathy.  IMPRESSION: No evidence of acute pulmonary thromboembolism.   Original Report Authenticated By: Jolaine Click, M.D.      1. Rapid palpitations   2. Chest pain      Date:  01/15/2013  Rate: 113  Rhythm: sinus tachycardia  QRS Axis: normal  Intervals: normal  ST/T Wave abnormalities: nonspecific ST changes and nonspecific T wave changes, interfior & lateral Q waves  Conduction Disutrbances:none  Narrative Interpretation:   Old EKG Reviewed: unchanged    MDM   Pt is a 32 y.o. female with pertinent PMHX of PVC, abnormal EKG, raynaud's who presents with about 1 month of increasingly frequent tachypalpitations, SOB, and for about 2 weeks mid sternal chest tightness.  Pt has had cardiology w/u for similar in past with negative w/u.  Seen last week for same w/ nml CXR, neg d-dimer.  Pt has recently d/c'd her yaz.  Will do CT PE study to r/o PE.  EKG unchanged from prior.  Trop negative at last visit. Doubt pain is cardiac in nature.  Also doubt pna or other infectious etiology.  She has had recent nml hb and nml thyroid panel. HR improves during exam  which is otherwise benign.   CTPE study negative.  Will d/c home to f/u with EP tomorrow and referral to pulmonology.  Pt encouraged to try her Rx for xanax as there may be a component of anxiety complicating symptoms.   1. Rapid palpitations   2. Chest pain      Labs and imaging considered in decision making, reviewed by myself.  Imaging interpreted by radiology. Pt care discussed with my attending, Dr. Jeraldine Loots.     Toy Cookey, MD 01/16/13 [phone]

## 2013-01-15 NOTE — ED Notes (Signed)
Pt reports over the past 2 weeks she has been having increased SOB and pain in her chest. States she was walking her dog today and felt like she just could not catch her breath.

## 2013-01-15 NOTE — ED Notes (Signed)
TRANSPORTED TO CT SCAN.  

## 2013-01-16 ENCOUNTER — Ambulatory Visit (INDEPENDENT_AMBULATORY_CARE_PROVIDER_SITE_OTHER): Payer: 59 | Admitting: Internal Medicine

## 2013-01-16 VITALS — BP 136/92 | HR 108 | Ht 65.0 in | Wt 114.8 lb

## 2013-01-16 DIAGNOSIS — R000 Tachycardia, unspecified: Secondary | ICD-10-CM

## 2013-01-16 DIAGNOSIS — I498 Other specified cardiac arrhythmias: Secondary | ICD-10-CM

## 2013-01-16 DIAGNOSIS — R002 Palpitations: Secondary | ICD-10-CM

## 2013-01-16 MED ORDER — METOPROLOL TARTRATE 25 MG PO TABS: ORAL_TABLET | ORAL | Status: DC

## 2013-01-16 MED ORDER — PROPRANOLOL HCL ER 60 MG PO CP24: ORAL_CAPSULE | ORAL | Status: DC

## 2013-01-16 MED ORDER — ATENOLOL 25 MG PO TABS: ORAL_TABLET | ORAL | Status: DC

## 2013-01-16 NOTE — Patient Instructions (Signed)
Your physician has recommended you make the following change in your medication:  1) Start thermatabs (salt tabs) over the counter - You are being given prescriptions for 3 different beta blockers to use. You may try them in any order. DO NOT take more than one at a time. They are as follows: 2) Atenolol 25 mg one tablet by mouth once daily as directed. 3) Metoprolol tart 25 mg one tablet by mouth twice daily as directed. 4) Inderal LA 60 mg one tablet by mouth once daily as directed.  Your physician recommends that youhave lab work: 24 hour urine for sodium  Your physician recommends that you schedule a follow-up appointment in: 4 weeks with Dr. Graciela Husbands.

## 2013-01-16 NOTE — Assessment & Plan Note (Addendum)
Based on P wave morphologies and multiple electrocardiograms over months demonstrating rapid rates I suspect that this is sinus tachycardia. It is possible that it is an atrial tachycardia but I think that is much less likely.  Sinus tachycardia can either be primary or secondary. With recent elevations in blood pressure  I think the elimination of catecholamine excreting tumors is important and hence we'll undertake urine for catecholamines.  She has some symptoms of orthostatic intolerance of postural tachycardia. It is possible that this represents a dysautonomia either as a inappropriate sinus tachycardia with without some orthostatic component. She did not tolerate metoprolol succinate. We will try her on low-dose alternative beta blockers. Her diet is also quite salt deplete and so we willsupplement her diet.  We will begin tehese interventions following the urine studies.

## 2013-01-16 NOTE — Progress Notes (Signed)
ELECTROPHYSIOLOGY CONSULT NOTE  Patient ID: Kelly Fischer, MRN: [id number], DOB/AGE: 08/18/1981 32 y.o. Admit date: (Not on file) Date of Consult: 01/16/2013  Primary Physician: Astrid Divine, MD Primary Cardiologist:    Chief Complaint: tachypalpitations   HPI Kelly Fischer is a 32 y.o. female  Seen at the request of Dr. Valentina Lucks because of palpitations.  She is a Engineer, civil (consulting) in the emergency room. She has a history of intermittent palpitations dating back to high school.at that time the episodes were relatively brief.  About 6 months ago she began having tachypalpitations that will quite short in duration. Heart rates were recorded in the 1:30-140 range. She saw Dr. Johney Frame who thought it might be related to dehydration and alcohol exposure. Her symptoms at that point had largely daily resolved. Metoprolol as prescribed. She took one dose and this amiodarone diltiazem instead and she did not like the beta blocker.   In January she had the abrupt onset of palpitations and since that time she has felt really lousy. She has been seen in the emergency room on number of occasions with negative d-dimer , normal troponins, and actually underwent CT scanning to exclude pulmonary embolism. Blood work has apparently been normal. She is previously on oral contraception. She complains of dyspnea on exertion some orthostatic intolerance nausea. She denies dry mouth she has some dry eyes there is no constipation or difficulties with urination.  She is some orthostatic lightheadedness. She does not have significant shower intolerance. There is no significant heat intolerance. Her diet is salt deplete butf volume replete.    Past Medical History  Diagnosis Date  . Raynaud's disease   . PVC (premature ventricular contraction)   . Abnormal EKG     Prominent QRS voltage, short PR interval, ST-T wave abnormalities.       Surgical History:  Past Surgical History  Procedure Laterality Date  .  Wisdom tooth extraction  2002     Home Meds: Prior to Admission medications   Medication Sig Start Date End Date Taking? Authorizing Provider  ALPRAZolam (XANAX) 0.25 MG tablet Take 1 tablet (0.25 mg total) by mouth at bedtime as needed for sleep. 01/05/13  Yes Loren Racer, MD  B Complex-C (B-COMPLEX WITH VITAMIN C) tablet Take 1 tablet by mouth daily.   Yes Historical Provider, MD  Calcium Carbonate-Vitamin D (CALCIUM + D PO) Take 1 tablet by mouth daily.   Yes Historical Provider, MD  diltiazem (CARDIZEM) 30 MG tablet Take 30 mg by mouth every 6 (six) hours as needed (for palpitations).  09/03/12  Yes Hillis Range, MD  ibuprofen (ADVIL,MOTRIN) 200 MG tablet Take 600 mg by mouth daily as needed for pain.   Yes Historical Provider, MD  Levonorgestrel (SKYLA) 13.5 MG IUD by Intrauterine route. Once every 3 years - implanted 01/08/2013   Yes Historical Provider, MD    Allergies:  Allergies  Allergen Reactions  . Zithromax (Azithromycin) Rash    History   Social History  . Marital Status: Married    Spouse Name: N/A    Number of Children: N/A  . Years of Education: N/A   Occupational History  . RN [employer]   Social History Main Topics  . Smoking status: Never Smoker   . Smokeless tobacco: Never Used  . Alcohol Use: Yes  . Drug Use: No  . Sexually Active: Not on file   Other Topics Concern  . Not on file   Social History Narrative   There is no history of premature  coronary artery disease in her parents or siblings. No first-degree relatives have pacemakers or other devices.     No family history on file.   ROS:  Please see the history of present illness.   Negative except anxiey and raynauds  All other systems reviewed and negative.    Physical Exam:   Blood pressure 136/92, pulse 108, height 5\' 5"  (1.651 m), weight 114 lb 12.8 oz (52.073 kg), last menstrual period 12/29/2012. General: Well developed, well nourished female in no acute distress. Head:  Normocephalic, atraumatic, sclera non-icteric, no xanthomas, nares are without discharge. Lymph Nodes:  none Back: without scoliosis/kyphosis  , no CVA tendersness Neck: Negative for carotid bruits. JVD not elevated. Lungs: Clear bilaterally to auscultation without wheezes, rales, or rhonchi. Breathing is unlabored. Heart: RRR with S1 S2.  murmur , rubs, or gallops appreciated. Abdomen: Soft, non-tender, non-distended with normoactive bowel sounds. No hepatomegaly. No rebound/guarding. No obvious abdominal masses. Msk:  Strength and tone appear normal for age. Extremities: No clubbing or cyanosis. No edema.  Distal pedal pulses are 2+ and equal bilaterally. Skin: Warm and Dry Neuro: Alert and oriented X 3. CN III-XII intact Grossly normal sensory and motor function . Psych:  Responds to questions appropriately with a normal affect.      Labs: Cardiac Enzymes No results found for this basename: CKTOTAL, CKMB, TROPONINI,  in the last 72 hours CBC Lab Results  Component Value Date   WBC 10.2 01/15/2013   HGB 14.1 01/15/2013   HCT 40.5 01/15/2013   MCV 85.8 01/15/2013   PLT 312 01/15/2013   PROTIME: No results found for this basename: LABPROT, INR,  in the last 72 hours Chemistry  Recent Labs Lab 01/15/13 1900  NA 134*  K 3.4*  CL 98  CO2 26  BUN 6  CREATININE 0.66  CALCIUM 9.9  GLUCOSE 96   Lipids No results found for this basename: CHOL, HDL, LDLCALC, TRIG   BNP No results found for this basename: probnp   Miscellaneous Lab Results  Component Value Date   DDIMER <0.27 01/05/2013    Radiology/Studies:  Dg Chest 2 View  01/05/2013  *RADIOLOGY REPORT*  Clinical Data: Short of breath  CHEST - 2 VIEW  Comparison: 08/05/2012  Findings: Lungs are clear without infiltrate or effusion. Vascularity normal and heart size is normal.  Mild scoliosis.  IMPRESSION: No acute abnormality and no interval change.   Original Report Authenticated By: Janeece Riggers, M.D.    Ct Angio Chest Pe  W/cm &/or Wo Cm  01/15/2013  *RADIOLOGY REPORT*  Clinical Data: Chest pain.  Short of breath.  Tachycardia.  CT ANGIOGRAPHY CHEST  Technique:  Multidetector CT imaging of the chest using the standard protocol during bolus administration of intravenous contrast. Multiplanar reconstructed images including MIPs were obtained and reviewed to evaluate the vascular anatomy.  Contrast: 85mL OMNIPAQUE IOHEXOL 350 MG/ML SOLN  Comparison: None.  Findings: There are no filling defects in the pulmonary arterial tree to suggest acute pulmonary thromboembolism.  No evidence of aortic dissection or aneurysm.  No pericardial effusion.  No pleural effusion.  No pneumothorax.  Clear lungs.  No abnormal mediastinal adenopathy.  IMPRESSION: No evidence of acute pulmonary thromboembolism.   Original Report Authenticated By: Jolaine Click, M.D.     EKG:  Multiple ones were reviewed. She has rates ranging from 95-156. They all have the same P wave morphology which was biphasic in lead V1 upright in the inferolateral leads.   Assessment and Plan:  Kelly Fischer

## 2013-01-17 NOTE — ED Provider Notes (Signed)
  I performed a history and physical examination of Kelly Fischer and discussed her management with Dr. Micheline Maze.  I agree with the history, physical, assessment, and plan of care, with the following exceptions: None  I saw the ECG, relevant labs and studies - I agree with the interpretation.  On my exam this previously well young female was in no distress, though she continues to complain of dyspnea, tachycardia.  The patient has had a thorough evaluation thus far, though prior to tonight, no CT angiography has been conducted.  This test was normal.  Given the patient's persistent tachycardia, mild dyspnea, additional evaluation is necessary, though she is appropriate to continue this as an outpatient.  Specifically we discussed the need to follow up with both her cardiologist tomorrow, as scheduled, and pulmonology, with additional consideration of endocrinology.  The patient was also encouraged to use her anxiolytics for symptomatic control.   Elyse Jarvis, MD 01/17/13 [phone]

## 2013-01-18 ENCOUNTER — Other Ambulatory Visit: Payer: 59

## 2013-01-20 ENCOUNTER — Telehealth: Payer: Self-pay | Admitting: Physician Assistant

## 2013-01-20 NOTE — Telephone Encounter (Signed)
Pt called because she still feels terrible. She has tried the propanolol, it is better than the metoprolol but she still feels bad.   Currently her heart rate is 80. Her BP yesterday was 140/90. She feels frustrated and anxious that she cannot get better.  Reassured her that we will keep working with her to get her better. Encouraged her to check her HR/BP and correlate that with symptoms. Advised her if HR/BP are elevated, can take an extra propanolol or a Cardizem. She will continue current therapy without any changes and wants to be rechecked this week. Will have ofc call her.

## 2013-01-21 ENCOUNTER — Encounter: Payer: Self-pay | Admitting: Internal Medicine

## 2013-01-21 ENCOUNTER — Telehealth: Payer: Self-pay | Admitting: Internal Medicine

## 2013-01-21 NOTE — Telephone Encounter (Signed)
New Problem     Pt has a lot questions regarding diagnosis from last week. Would like to speak to nurse.

## 2013-01-21 NOTE — Telephone Encounter (Signed)
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

## 2013-01-22 LAB — METANEPHRINES, URINE, 24 HOUR: Metaneph Total, Ur: 400 mcg/24 h (ref 94–604)

## 2013-01-22 NOTE — Telephone Encounter (Signed)
Will forward to Dr. Graciela Husbands for review and recommendations for work that I can put in letter form for her.

## 2013-01-22 NOTE — Telephone Encounter (Signed)
New problem   Pt want a note for work. Pt taking new medication (Beta Blocker tropanolol) and needs to get adjusted to her and her condition medication. Pt is very frustrated and stated it was too much for her to work 12hrs and deal with her condition. Pt will bring by FMLA forms, but in the meantime she need a note for working restrictions.

## 2013-01-23 NOTE — Telephone Encounter (Signed)
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

## 2013-01-23 NOTE — Telephone Encounter (Signed)
Pt calling back concerned she hasn't gotten a call, she said she didn't need FMLA papers, although she has them, she doesn't feel she needs them at this time, she also said said wasn't asking for work restrictions but for reduced hours, pls call at [phone] requesting call back today

## 2013-01-24 ENCOUNTER — Telehealth: Payer: Self-pay | Admitting: *Deleted

## 2013-01-24 ENCOUNTER — Encounter: Payer: Self-pay | Admitting: Internal Medicine

## 2013-01-24 NOTE — Telephone Encounter (Signed)
Pt walked into office requesting note from Dr. Graciela Husbands regarding reduced work hours. Also requesting urine results.  I told pt urine result values were in normal range but that Dr. Graciela Husbands would need to evaluate and give final result recommendations. We would let her know once he had reviewed.  She works 12 hours shifts on the weekends and is requesting to pick up note from Dr.Klein tomorrow.  (see previous phone note for additional documentation regarding note). Pt is aware Dr. Graciela Husbands not in office today and he  will be back in office tomorrow.  She reports she continues to have shortness of breath, is not sleeping well and is eating poorly.

## 2013-01-25 ENCOUNTER — Ambulatory Visit (INDEPENDENT_AMBULATORY_CARE_PROVIDER_SITE_OTHER): Payer: 59 | Admitting: Pulmonary Disease

## 2013-01-25 ENCOUNTER — Encounter: Payer: Self-pay | Admitting: Pulmonary Disease

## 2013-01-25 VITALS — BP 120/70 | HR 84 | Temp 99.1°F | Ht 65.0 in | Wt 114.8 lb

## 2013-01-25 DIAGNOSIS — R0989 Other specified symptoms and signs involving the circulatory and respiratory systems: Secondary | ICD-10-CM

## 2013-01-25 DIAGNOSIS — R0600 Dyspnea, unspecified: Secondary | ICD-10-CM | POA: Insufficient documentation

## 2013-01-25 NOTE — Telephone Encounter (Signed)
Printed out letter that Dr Graciela Husbands dictated and told her it would be at the front desk for her.

## 2013-01-25 NOTE — Patient Instructions (Addendum)
We discussed behaviour techniques & abdominal breathing Doubt asthma or allergies

## 2013-01-25 NOTE — Assessment & Plan Note (Addendum)
Her dyspnea may have been triggered by her cardiac symptoms but now seems to take in her life of its own. This may be related to her anxiety surrounding her cardiac symptoms or due to vocal cord dysfunction. I doubt onset of asthma, other pulmonary etiology seems unlikely. CT angiogram has been negative for pulmonary embolism. I doubt that allergies are contributing. We discussed her situation and I simply provided reassurance today that primary pulmonary pathology seems unlikely. He also discussed behavioral techniques such as abdominal breathing and biofeedback to help her get over her episodes of dyspnea. I do not feel that further testing is necessary at this time from a pulmonary standpoint

## 2013-01-25 NOTE — Telephone Encounter (Signed)
Kelly Fischer called the patient and made her aware that her letter is ready.

## 2013-01-25 NOTE — Progress Notes (Signed)
Subjective:    Patient ID: Kelly Fischer, female    DOB: 1981/08/14, 32 y.o.   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 She has been evaluated for SVT by cardiology and EP. Dr. Johney Frame started her on Cardizem in 9/13 and Dr.Klein in 1/14 started her on beta blockers. She is felt to have some autonomic dysfunction with orthostatic hypotension. Her palpitations have decreased, she's been taking propranolol for one week. But she reports shortness of breath on exertion and sometimes even lying flat. Episodes are intermittent, lasting few minutes up to an hour and no obvious relieving factors. She denies sinus drainage, significant heartburn or obvious allergies. There is no childhood history of asthma. Initially episodes occurred with palpitations but now occur independently. She has been seen in the emergency room on number of occasions with negative d-dimer , normal troponins, and actually underwent CT scanning to exclude pulmonary embolism. Blood work - of note thyroid function tests has  been normal  She was previously on oral contraception.  She reports some insomnia since starting beta blocker. She reports difficulty taking a deep breath in but denies wheezing or difficulty exhaling. Dyspnea does not bother her in her sleep. Maternal grandmother had lupus - she denies skin rash, joint swelling or pains. She reports significant anxiety around her cardiac symptoms and is looking for reassurance from a pulmonary standpoint    Past Medical History  Diagnosis Date  . Raynaud's disease   . PVC (premature ventricular contraction)   . Abnormal EKG     Prominent QRS voltage, short PR interval, ST-T wave abnormalities.   . Tachycardia     Past Surgical History  Procedure Laterality Date  . Wisdom tooth extraction  2002    Allergies  Allergen Reactions  . Zithromax (Azithromycin) Rash    History   Social History  . Marital Status: Married    Spouse  Name: N/A    Number of Children: N/A  . Years of Education: N/A   Occupational History  . RN [employer]   Social History Main Topics  . Smoking status: Never Smoker   . Smokeless tobacco: Never Used  . Alcohol Use: Yes     Comment: occasional  . Drug Use: No  . Sexually Active: Not on file   Other Topics Concern  . Not on file   Social History Narrative   There is no history of premature coronary artery disease in her parents or siblings. No first-degree relatives have pacemakers or other devices.     Review of Systems  Constitutional: Positive for appetite change and unexpected weight change.  HENT: Positive for congestion, sore throat and sneezing. Negative for ear pain, trouble swallowing and dental problem.   Respiratory: Positive for cough and shortness of breath.   Cardiovascular: Positive for chest pain, palpitations and leg swelling.  Gastrointestinal: Positive for abdominal pain.  Musculoskeletal: Positive for joint swelling.  Skin: Negative for rash.  Neurological: Negative for headaches.  Psychiatric/Behavioral: The patient is nervous/anxious.        Objective:   Physical Exam  Gen. Pleasant, well-nourished, in no distress, normal affect ENT - no lesions, no post nasal drip Neck: No JVD, no thyromegaly, no carotid bruits Lungs: no use of accessory muscles, no dullness to percussion, clear without rales or rhonchi  Cardiovascular: Rhythm regular, heart sounds  normal, no murmurs or gallops, no peripheral edema Abdomen: soft and non-tender, no hepatosplenomegaly, BS normal. Musculoskeletal: No deformities, no cyanosis or clubbing  Neuro:  alert, non focal       Assessment & Plan:

## 2013-01-28 ENCOUNTER — Telehealth: Payer: Self-pay | Admitting: Physician Assistant

## 2013-01-28 NOTE — Telephone Encounter (Signed)
Pt called this evening. She is frustrated with how she feels. She was recently diagnosed with POTS versus inappropriate sinus tach by Dr. Graciela Husbands - her diagnosis has been somewhat elusive. Was given rx's to try of propranolol, metoprolol, and atenolol. She started the propranolol last week and feels very "washed out" today. She feels extremely fatigued. No CP, palpitations, presyncope or syncope, or any other acute symptoms, just feels like she has no energy. She was wondering if this could be related to the medicine. I told her it certainly could, as some people react to propranolol with weakness and fatigue. BP 125/85 and HR 80. I instructed her to stay hydrated tonight and to let us know how she is feeling in the AM. If she is feeling still very weak in AM, she may need in-person evaluation for other causes that may not be related to her medication. I told her she should move on to the next beta blocker tomorrow evening (takes her dose at night) if feeling back to normal. ER precautions given. She verbalized understanding and gratitude. Dayna Dunn PA-C

## 2013-01-29 ENCOUNTER — Telehealth: Payer: Self-pay | Admitting: Internal Medicine

## 2013-01-29 NOTE — Telephone Encounter (Signed)
I left a message for the patient to call. Dr. Graciela Husbands is aware of her message and states he would see her in [place] at 2:45pm today if she is willing to go there. I have stated this on the message I left for her.

## 2013-01-29 NOTE — Telephone Encounter (Signed)
New Prob   Pt is not feeling well. C/o weakness, feeling like she needs to pass out, fatigue. Would like to be seen today, or as soon as possible.

## 2013-01-30 ENCOUNTER — Ambulatory Visit (INDEPENDENT_AMBULATORY_CARE_PROVIDER_SITE_OTHER): Payer: 59 | Admitting: Internal Medicine

## 2013-01-30 ENCOUNTER — Encounter: Payer: Self-pay | Admitting: Internal Medicine

## 2013-01-30 VITALS — BP 125/83 | HR 106 | Ht 65.0 in | Wt 115.2 lb

## 2013-01-30 DIAGNOSIS — I498 Other specified cardiac arrhythmias: Secondary | ICD-10-CM

## 2013-01-30 DIAGNOSIS — E340 Carcinoid syndrome: Secondary | ICD-10-CM

## 2013-01-30 DIAGNOSIS — R000 Tachycardia, unspecified: Secondary | ICD-10-CM

## 2013-01-30 NOTE — Telephone Encounter (Signed)
Appt made to see Dr. Graciela Husbands today in [place].

## 2013-01-30 NOTE — Telephone Encounter (Signed)
Follow Up   Calling back regarding mess she received yesterday from [facility]. Concerned and would like to speak to someone.

## 2013-01-30 NOTE — Progress Notes (Signed)
skf Patient Care Team: Maurice Small, MD as PCP - General (Family Medicine)   HPI  Kelly Fischer is a 32 y.o. 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 When I saw her first about 2 weeks ago her history was notable for being salt deplete  We tried on a low dose beta blocker. She took they Inderal and had significant improvement in tachycardia and blood pressure. She is actually able to work this weekend, I have written for limited shifts.  However, on Monday she became nauseated again and was unable to eat.she called the on-call doctor who thought maybe it was related to the propranolol and it was stopped. He is currently taking atenolol.  GI symptoms have been a prominent part of this. There is nausea and anorexia and aversion to food. There has been some diarrhea but no significant vomiting.  She has not seen in GI   In the interval she has seen Dr. Vassie Loll for dyspnea. It was his impression that was at least in part related to anxiety and he offered reassurance and behavioral techniques    Past Medical History  Diagnosis Date  . Raynaud's disease   . PVC (premature ventricular contraction)   . Abnormal EKG     Prominent QRS voltage, short PR interval, ST-T wave abnormalities.   . Tachycardia     Past Surgical History  Procedure Laterality Date  . Wisdom tooth extraction  2002    Current Outpatient Prescriptions  Medication Sig Dispense Refill  . atenolol (TENORMIN) 25 MG tablet Take 25 mg by mouth daily.      . B Complex-C (B-COMPLEX WITH VITAMIN C) tablet Take 1 tablet by mouth daily.      . Calcium Carbonate-Vitamin D (CALCIUM + D PO) Take 1 tablet by mouth daily.      Marland Kitchen diltiazem (CARDIZEM) 30 MG tablet Take 30 mg by mouth every 6 (six) hours as needed (for palpitations).       . Levonorgestrel (SKYLA) 13.5 MG IUD by Intrauterine route. Once every 3 years - implanted 01/08/2013      . NON FORMULARY Salt  tablets twice daily      . ALPRAZolam (XANAX) 0.25 MG tablet Take 1 tablet (0.25 mg total) by mouth at bedtime as needed for sleep.  30 tablet  0  . propranolol ER (INDERAL LA) 60 MG 24 hr capsule Take one tablet by mouth daily as directed  30 capsule  0   No current facility-administered medications for this visit.    Allergies  Allergen Reactions  . Zithromax (Azithromycin) Rash    Review of Systems negative except from HPI and PMH  Physical Exam BP 118/83  Pulse 93  Ht 5\' 5"  (1.651 m)  Wt 115 lb 3.2 oz (52.254 kg)  BMI 19.17 kg/m2  LMP 12/29/2012 Well developed and well nourished in no acute distress     Assessment and  Plan

## 2013-01-30 NOTE — Assessment & Plan Note (Signed)
Working diagnosis   has been in dysautonomic syndrome manifested by sinus tachycardia. And accompanied by GI symptoms. There is a marked improvement with the beta blocker supportive of this diagnosis. The underlying cause remains presumed. With the diarrhea we'll undertake a 5 HIAA. She will continue salt repletion she will increase her fluid repletion. She will continue on atenolol.  I suggested that she pursue GI evaluation for possible primary GI causes for her symptoms. We will plan to see her about 3 weeks. We spent 30 minutes counseling on the above diagnoses.  There is also a strong emotional component which may be primary or secondary. She was intermittently quite tearful. I suggested that she consider counseling both to pursue adjustment to the illness is impact on her life as well as the possibility that her life is impacting her illness

## 2013-01-30 NOTE — Patient Instructions (Addendum)
Your physician recommends that you schedule a follow-up appointment in:  Your physician recommends that you continue on your current medications as directed. Please refer to the Current Medication list given to you today. Your physician recommends that you return for lab work in:  URINE 5H1AA

## 2013-01-31 ENCOUNTER — Telehealth: Payer: Self-pay | Admitting: Internal Medicine

## 2013-01-31 NOTE — Telephone Encounter (Signed)
New Prob   Pt requesting referral for GI (Dr. Minerva Ends) so she can be sooner then April.

## 2013-01-31 NOTE — Telephone Encounter (Signed)
I left a message for the patient's husband of the message that Dr. Graciela Husbands sent to GI.

## 2013-01-31 NOTE — Telephone Encounter (Signed)
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  She saw me two weeks ago with 3 month hx of palps with sinus tach assoc with significant GI symptoms, inclu nausea, anorexia as well as diarrhea.  She improved somewhat with BB and Na/ H2O repletion consistent with a working diagnosis of autonomic dysfunction.  We have urine pending for 5HIAA.  She called [facility] and was told the first appointment was April  Is someone available to see her to make sure there is no GI pathology to cause the symptoms    Thanks steve

## 2013-01-31 NOTE — Telephone Encounter (Signed)
I spoke with the patient. She states that she thought Dr. Graciela Husbands had recommended Dr. Matthias Hughs and Dr. Delle Reining for GI referrals. Dr. Matthias Hughs is not taking patients and per [place], there is no GI doctor who can see her before April. She would like [place] to call in a referral for her. I explained I will clarify with Dr. Graciela Husbands who he would her to see and I will call a referral for her. She is going to work from 1-7 pm today. She would like me to call her husband, Kelly Fischer, with her appointment info.

## 2013-02-04 ENCOUNTER — Telehealth: Payer: Self-pay

## 2013-02-04 ENCOUNTER — Ambulatory Visit (INDEPENDENT_AMBULATORY_CARE_PROVIDER_SITE_OTHER): Payer: 59 | Admitting: *Deleted

## 2013-02-04 DIAGNOSIS — R0600 Dyspnea, unspecified: Secondary | ICD-10-CM

## 2013-02-04 DIAGNOSIS — R002 Palpitations: Secondary | ICD-10-CM

## 2013-02-04 DIAGNOSIS — R0989 Other specified symptoms and signs involving the circulatory and respiratory systems: Secondary | ICD-10-CM

## 2013-02-04 DIAGNOSIS — I493 Ventricular premature depolarization: Secondary | ICD-10-CM

## 2013-02-04 NOTE — Telephone Encounter (Signed)
Message copied by Annett Fabian on Mon Feb 04, 2013  2:46 PM ------      Message from: Claudette Head T      Created: Fri Feb 01, 2013 10:30 AM      Regarding: RE: help with consult             Lavonna Rua, Please help get this patient worked in. See Dr. Odessa Fleming note. 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  She saw me two weeks ago with 3 month hx of palps with sinus tach assoc with significant GI symptoms, inclu nausea, anorexia as well as diarrhea.  She improved somewhat with BB and Na/ H2O repletion consistent with a working diagnosis of autonomic dysfunction.  We have urine pending for 5HIAA.  She called [facility] and was told the first appointment was April  Is someone available to see her to make sure there is no GI pathology to cause the symptoms      Thanks steve       ------

## 2013-02-04 NOTE — Telephone Encounter (Signed)
Patient is offered an extender appt , she is scheduled for 02/05/13 with Mike Gip PA

## 2013-02-05 ENCOUNTER — Ambulatory Visit (INDEPENDENT_AMBULATORY_CARE_PROVIDER_SITE_OTHER): Payer: 59 | Admitting: Physician Assistant

## 2013-02-05 ENCOUNTER — Encounter: Payer: Self-pay | Admitting: Physician Assistant

## 2013-02-05 ENCOUNTER — Other Ambulatory Visit (INDEPENDENT_AMBULATORY_CARE_PROVIDER_SITE_OTHER): Payer: 59

## 2013-02-05 VITALS — BP 118/74 | HR 78 | Ht 65.0 in | Wt 114.0 lb

## 2013-02-05 DIAGNOSIS — R634 Abnormal weight loss: Secondary | ICD-10-CM

## 2013-02-05 DIAGNOSIS — G8929 Other chronic pain: Secondary | ICD-10-CM

## 2013-02-05 DIAGNOSIS — R599 Enlarged lymph nodes, unspecified: Secondary | ICD-10-CM

## 2013-02-05 DIAGNOSIS — R110 Nausea: Secondary | ICD-10-CM

## 2013-02-05 DIAGNOSIS — R590 Localized enlarged lymph nodes: Secondary | ICD-10-CM

## 2013-02-05 DIAGNOSIS — R1013 Epigastric pain: Secondary | ICD-10-CM

## 2013-02-05 LAB — COMPREHENSIVE METABOLIC PANEL
Albumin: 3.9 g/dL (ref 3.5–5.2)
BUN: 10 mg/dL (ref 6–23)
Calcium: 9.3 mg/dL (ref 8.4–10.5)
Chloride: 103 mEq/L (ref 96–112)
Creatinine, Ser: 0.7 mg/dL (ref 0.4–1.2)
GFR: 112.38 mL/min (ref >60.00)
Glucose, Bld: 89 mg/dL (ref 70–99)
Potassium: 4.3 mEq/L (ref 3.5–5.1)

## 2013-02-05 LAB — CBC WITH DIFFERENTIAL/PLATELET
Basophils Relative: 0.5 % (ref 0.0–3.0)
HCT: 39 % (ref 36.0–46.0)
Hemoglobin: 13 g/dL (ref 12.0–15.0)
Lymphocytes Relative: 31.5 % (ref 12.0–46.0)
Lymphs Abs: 2.1 10*3/uL (ref 0.7–4.0)
MCHC: 33.4 g/dL (ref 30.0–36.0)
Monocytes Relative: 9 % (ref 3.0–12.0)
Neutro Abs: 3.9 10*3/uL (ref 1.4–7.7)
RBC: 4.4 Mil/uL (ref 3.87–5.11)

## 2013-02-05 MED ORDER — ONDANSETRON HCL 4 MG PO TABS: ORAL_TABLET | ORAL | Status: DC

## 2013-02-05 NOTE — Telephone Encounter (Signed)
The patient saw Dr. Bretta Bang in GI today.

## 2013-02-05 NOTE — Patient Instructions (Addendum)
Please go to the basement level to have your labs drawn.  We have scheduled the MR ABD and PELVIS at [facility]  For Thurs 02-07-2013.  Be NPO after 8:45 am.    We sent prescription to [facility] outpatient pharmacy for Zofran for nausea.

## 2013-02-06 ENCOUNTER — Encounter: Payer: Self-pay | Admitting: Physician Assistant

## 2013-02-06 NOTE — Progress Notes (Signed)
I agree with the plan outlined in this note 

## 2013-02-06 NOTE — Progress Notes (Signed)
Subjective:    Patient ID: Kelly Fischer, female    DOB: Jan 16, 1981, 32 y.o.   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 Graciela Husbands for evaluation of 3 month history of nausea anorexia and intermittent diarrhea in the setting of ongoing workup for possible autonomic dysfunction or carcinoid. She says her current symptoms started in early January initially with tachycardia elevated blood pressure and some chest pain. About a week later she had onset of nausea and vomiting has had increased problems with anxiety ever since. She's been paced placed on a beta blocker which did help with her tachycardia however she continues to have symptoms. She says her appetite is very poor and she is lost about 5 pounds. She's been trying to drink Ensure and fluids but can't seem to tolerate a lot of solid food. She's also noticed increased frequency of bowel movements. She said in the past she is always had one to 2 bowel movements per day and now she is having larger volume looser stools often occurring first thing in the morning when she gets up and then urgently after meals. She states her nausea comes and goes  And she has an underlying ongoing queasiness and intermittent dry heaves. On further questioning she says she has had some episodes of flushing or feeling hot and sweaty recently. He does not tie these necessarily 2 episodes of nausea or diarrhea. She is being treated with ongoing beta blocker and sodium repletion. Urine for metanephrines was within normal limits in urine for 5-HIAA is pending. She has had CT of the chest with angio which was negative and an echo which shows an ejection fraction of 55-60%. Last labs were done 01/15/2013 CBC was unremarkable of her function studies were unremarkable potassium was 3.4.     Review of Systems  Constitutional: Positive for appetite change, fatigue and unexpected weight change.   HENT: Negative.   Eyes: Negative.   Respiratory: Negative.   Cardiovascular: Positive for palpitations.  Gastrointestinal: Positive for nausea and diarrhea.  Endocrine: Negative.   Musculoskeletal: Negative.   Skin: Negative.   Allergic/Immunologic: Negative.   Neurological: Negative.   Hematological: Negative.   Psychiatric/Behavioral: The patient is nervous/anxious.    Outpatient Prescriptions Prior to Visit  Medication Sig Dispense Refill  . ALPRAZolam (XANAX) 0.25 MG tablet Take 1 tablet (0.25 mg total) by mouth at bedtime as needed for sleep.  30 tablet  0  . atenolol (TENORMIN) 25 MG tablet Take 25 mg by mouth daily.      . B Complex-C (B-COMPLEX WITH VITAMIN C) tablet Take 1 tablet by mouth daily.      . Calcium Carbonate-Vitamin D (CALCIUM + D PO) Take 1 tablet by mouth daily.      Marland Kitchen diltiazem (CARDIZEM) 30 MG tablet Take 30 mg by mouth every 6 (six) hours as needed (for palpitations).       . Levonorgestrel (SKYLA) 13.5 MG IUD by Intrauterine route. Once every 3 years - implanted 01/08/2013      . NON FORMULARY Salt tablets twice daily      . propranolol ER (INDERAL LA) 60 MG 24 hr capsule Take one tablet by mouth daily as directed  30 capsule  0   No facility-administered medications prior to visit.   Allergies  Allergen Reactions  . Zithromax (Azithromycin) Rash   Patient Active Problem List  Diagnosis  . Rapid palpitations  .  PVC (premature ventricular contraction)  . Abnormal EKG  . Sinus tachycardia-Inappropriate  . Dyspnea   History  Substance Use Topics  . Smoking status: Never Smoker   . Smokeless tobacco: Never Used  . Alcohol Use: No     Comment: occasional      family history includes Heart disease in her mother and Lupus in her maternal grandmother.  Objective:   Physical Exam  well-developed thin young white female in no acute distress, pleasant blood pressure 118/74 pulse 78 height 5 foot 5 weight 114. HEENT; nontraumatic normocephalic EOMI  PERRLA sclera anicteric,Neck; Supple no JVD, Cardiovascular; regular rate and rhythm with S1-S2 no murmur or gallop, Pulmonary; clear bilaterally, Abdomen; soft basically nontender there is no palpable mass or hepatosplenomegaly bowel sounds are present she does have some small shotty mobile inguinal nodes bilaterally which are nontender, Rectal; exam not done, Extremities; no clubbing cyanosis or edema skin warm dry, Psych ;mood and affect appropriate- she is anxious.        Assessment & Plan:  #84 a 32-year-old female with 3 month history of symptoms including tachycardia chest pain nausea anxiety diarrhea and anorexia. Also occasional flushing. Unifying diagnosis is not clear-24-hour urine for 5 HIAA to rule out carcinoid syndrome is pending. Working diagnosis from a cardiac standpoint most consistent with POTS or autonomic dysfunction area Certainly nausea abdominal cramping and diarrhea can be seen with autonomic dysfunction. I'm not sure this explains her anorexia and weight loss. Will also need to consider connective tissue disorder or a vasculitis. Patient does have history of right not and has family history of lupus. She has not had any autoimmune workup as yet.  #2 anxiety #3 history Raynauds #4 inguinal lymphadenopathy  Plan; await 24-hour urine for 5 HIAA Schedule for CT of the abdomen and pelvis with contrast Patient has a prescription for Xanax at has not used it. I encouraged her to try the Xanax on a day when she is not working and see if this helps with her nausea and anorexia/food aversion which may in part be triggered by anxiety We'll start Zofran 4 mg every 6 hours as needed for nausea Check CBC ,CMET, CRP  today Followup in office with myself or Dr. Christella Hartigan in 7-10 days

## 2013-02-07 ENCOUNTER — Other Ambulatory Visit: Payer: Self-pay | Admitting: *Deleted

## 2013-02-07 ENCOUNTER — Ambulatory Visit (HOSPITAL_COMMUNITY)
Admission: RE | Admit: 2013-02-07 | Discharge: 2013-02-07 | Disposition: A | Discharge status: Home / Self Care | Payer: 59 | Source: Ambulatory Visit | Attending: Physician Assistant | Admitting: Physician Assistant

## 2013-02-07 ENCOUNTER — Ambulatory Visit (HOSPITAL_COMMUNITY): Admission: RE | Admit: 2013-02-07 | Payer: 59 | Source: Ambulatory Visit

## 2013-02-07 DIAGNOSIS — R634 Abnormal weight loss: Secondary | ICD-10-CM

## 2013-02-07 DIAGNOSIS — R1013 Epigastric pain: Secondary | ICD-10-CM

## 2013-02-07 DIAGNOSIS — R110 Nausea: Secondary | ICD-10-CM

## 2013-02-07 DIAGNOSIS — R590 Localized enlarged lymph nodes: Secondary | ICD-10-CM

## 2013-02-07 DIAGNOSIS — G8929 Other chronic pain: Secondary | ICD-10-CM

## 2013-02-08 ENCOUNTER — Telehealth: Payer: Self-pay | Admitting: Internal Medicine

## 2013-02-08 ENCOUNTER — Ambulatory Visit (HOSPITAL_COMMUNITY)
Admission: RE | Admit: 2013-02-08 | Discharge: 2013-02-08 | Disposition: A | Discharge status: Home / Self Care | Payer: 59 | Source: Ambulatory Visit | Attending: Physician Assistant | Admitting: Physician Assistant

## 2013-02-08 ENCOUNTER — Ambulatory Visit: Payer: 59 | Admitting: Internal Medicine

## 2013-02-08 DIAGNOSIS — K838 Other specified diseases of biliary tract: Secondary | ICD-10-CM | POA: Insufficient documentation

## 2013-02-08 DIAGNOSIS — R634 Abnormal weight loss: Secondary | ICD-10-CM | POA: Insufficient documentation

## 2013-02-08 DIAGNOSIS — R599 Enlarged lymph nodes, unspecified: Secondary | ICD-10-CM | POA: Insufficient documentation

## 2013-02-08 DIAGNOSIS — R1013 Epigastric pain: Secondary | ICD-10-CM | POA: Insufficient documentation

## 2013-02-08 DIAGNOSIS — R110 Nausea: Secondary | ICD-10-CM | POA: Insufficient documentation

## 2013-02-08 DIAGNOSIS — R590 Localized enlarged lymph nodes: Secondary | ICD-10-CM

## 2013-02-08 LAB — 5 HIAA, QUANTITATIVE, URINE, 24 HOUR: 5-HIAA, 24 Hr Urine: 8 mg/24 h — ABNORMAL HIGH (ref <=6.0)

## 2013-02-08 MED ORDER — IOHEXOL 300 MG/ML  SOLN
70.0000 mL | Freq: Once | INTRAMUSCULAR | Status: AC | PRN
  Administered 2013-02-08: 70 mL via INTRAVENOUS

## 2013-02-08 NOTE — Telephone Encounter (Signed)
Spoke with pt. Pt aware that results have not been reviewed.

## 2013-02-08 NOTE — Telephone Encounter (Signed)
Spoke with patient and let her know that Dr Graciela Husbands was going to have to do some researching over the weekend in regards to her high lab result and call her back with a plan early next week.  i told her I would check with him inMon and see his progress

## 2013-02-08 NOTE — Telephone Encounter (Signed)
New Problem:    Patient called in wanting to know if the results from her urinalysis have returned.  Please call back.

## 2013-02-11 ENCOUNTER — Other Ambulatory Visit: Payer: Self-pay | Admitting: *Deleted

## 2013-02-11 DIAGNOSIS — R0989 Other specified symptoms and signs involving the circulatory and respiratory systems: Secondary | ICD-10-CM

## 2013-02-11 DIAGNOSIS — R002 Palpitations: Secondary | ICD-10-CM

## 2013-02-11 DIAGNOSIS — I498 Other specified cardiac arrhythmias: Secondary | ICD-10-CM

## 2013-02-11 DIAGNOSIS — R0609 Other forms of dyspnea: Secondary | ICD-10-CM

## 2013-02-11 DIAGNOSIS — I4949 Other premature depolarization: Secondary | ICD-10-CM

## 2013-02-11 NOTE — Telephone Encounter (Signed)
Kelly Fischer spoke with patient today and gave her Dr Odessa Fleming recomendations

## 2013-02-12 ENCOUNTER — Telehealth: Payer: Self-pay | Admitting: Gastroenterology

## 2013-02-12 ENCOUNTER — Telehealth: Payer: Self-pay

## 2013-02-12 NOTE — Telephone Encounter (Signed)
Pt is very concerned that her Lymph nodes are swollen and have gotten bigger over the last couple of weeks and has noticed them in more places, under her arm, groin and neck.  Also, she has been scheduled for an appt with Dr Christella Hartigan on 02/19/13 and is concerned that the Urine test will not be back because she has to wait until next Friday to collect the urine (because of work).  Please advise

## 2013-02-13 NOTE — Telephone Encounter (Signed)
No adenopathy noted on the CT scan- can discuss at appt

## 2013-02-13 NOTE — Telephone Encounter (Signed)
Pt has been notified.

## 2013-02-13 NOTE — Telephone Encounter (Signed)
It is OK to see him on the 25th even if 24 hour urine not back---she should keep appt

## 2013-02-18 ENCOUNTER — Other Ambulatory Visit: Payer: 59

## 2013-02-19 ENCOUNTER — Ambulatory Visit (INDEPENDENT_AMBULATORY_CARE_PROVIDER_SITE_OTHER): Payer: 59 | Admitting: Gastroenterology

## 2013-02-19 ENCOUNTER — Other Ambulatory Visit: Payer: 59

## 2013-02-19 ENCOUNTER — Encounter: Payer: Self-pay | Admitting: Gastroenterology

## 2013-02-19 VITALS — BP 100/70 | HR 78 | Ht 65.0 in | Wt 114.0 lb

## 2013-02-19 DIAGNOSIS — R195 Other fecal abnormalities: Secondary | ICD-10-CM

## 2013-02-19 DIAGNOSIS — K838 Other specified diseases of biliary tract: Secondary | ICD-10-CM

## 2013-02-19 NOTE — Progress Notes (Signed)
HPI: This is a  very pleasant 31-year-old woman whom I am meeting for the first time today. She was seen here in our office to 3 weeks ago by one of the extenders. 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  Starting January, rapid HR, papatations, also SOB for about a week.  CP, abdominal pains, losing weight.  Anorexia.    Dr. Klein, dysautonomia diagnosed.  Can be from many causes.   Repeating 5 HIAA.  Has lost 8-10 pounds, but that plateued.  bloodwork 01/2013: normal cbc, normal crp, normal CMEt; urine metanephrines normal; 5HIAA urine elevated, currently being rechecked.  CT scan 01/2013 IMPRESSION: 1. Mild diffuse biliary ductal dilatation (cbd 9mm). Normal appearing gallbladder, and no etiology apparent by CT. Recommend correlationwith liver function tests; if abnormal, MRCP should be considered for further evaluation. 2. No other significant abnormality identified.  She has frequent stools, soft but formed.  She can have 3-4 BMs per day.  No nocturnal symptoms.  No blood in stool overtly.  Was very nauseas at first, now just anorexia.  Eating a lot of fruit.  NSAIDs rarely at all.  Rare caffeine.    Past Medical History  Diagnosis Date  . Raynaud's disease   . PVC (premature ventricular contraction)   . Abnormal EKG     Prominent QRS voltage, short PR interval, ST-T wave abnormalities.   . Tachycardia   . GERD (gastroesophageal reflux disease)     Past Surgical History  Procedure Laterality Date  . Wisdom tooth extraction  2002    Current Outpatient Prescriptions  Medication Sig Dispense Refill  . ALPRAZolam (XANAX) 0.25 MG tablet Take 1 tablet (0.25 mg total) by mouth at bedtime as needed for sleep.  30 tablet  0  . atenolol (TENORMIN) 25 MG tablet Take 25 mg by mouth daily.      . B Complex-C (B-COMPLEX WITH VITAMIN C) tablet Take 1 tablet by mouth daily.      . Calcium Carbonate-Vitamin D (CALCIUM + D PO) Take 1 tablet by mouth  daily.      . diltiazem (CARDIZEM) 30 MG tablet Take 30 mg by mouth every 6 (six) hours as needed (for palpitations).       . Levonorgestrel (SKYLA) 13.5 MG IUD by Intrauterine route. Once every 3 years - implanted 01/08/2013      . NON FORMULARY Salt tablets twice daily      . ondansetron (ZOFRAN) 4 MG tablet Take 1 tab every 6 hours as needed for nausea.  30 tablet  1   No current facility-administered medications for this visit.    Allergies as of 02/19/2013 - Review Complete 02/19/2013  Allergen Reaction Noted  . Zithromax (azithromycin) Rash 08/05/2012    Family History  Problem Relation Age of Onset  . Heart disease Mother   . Lupus Maternal Grandmother   . Colon polyps Mother     History   Social History  . Marital Status: Married    Spouse Name: N/A    Number of Children: N/A  . Years of Education: N/A   Occupational History  . RN [employer]   Social History Main Topics  . Smoking status: Never Smoker   . Smokeless tobacco: Never Used  . Alcohol Use: No     Comment: occasional  . Drug Use: No  . Sexually Active: Not on file   Other Topics Concern  . Not on file   Social History   Narrative   There is no history of premature coronary artery disease in her parents or siblings. No first-degree relatives have pacemakers or other devices.      Physical Exam: BP 100/70  Pulse 78  Ht 5' 5" (1.651 m)  Wt 114 lb (51.71 kg)  BMI 18.97 kg/m2 Constitutional: generally well-appearing Psychiatric: alert and oriented x3 Abdomen: soft, nontender, nondistended, no obvious ascites, no peritoneal signs, normal bowel sounds     Assessment and plan: 31 y.o. female with more frequent than usual stools, dyspepsia, anorexia, r recent nausea, dilated bile duct on recent imaging test  Perhaps she has celiac sprue. Going to get lab testing done for that. The bile duct is slightly dilated and I would like to proceed with upper endoscopic ultrasound for that. At the same  time I will get a very good examination of her luminal upper GI tract. Perhaps she has H. pylori causing some of the symptoms. For now she is going to start a single Imodium once a day.  

## 2013-02-19 NOTE — Patient Instructions (Addendum)
You will have labs checked today in the basement lab.  Please head down after you check out with the front desk  (celiac panel). Start one imodium a day, every morning after waking. You will be set up for an upper endoscopic ultrasound (60 min, radial +/- linear, MAC sedation) for biliary dilation.  Please see separate instructions                                              We are excited to introduce MyChart, a new best-in-class service that provides you online access to important information in your electronic medical record. We want to make it easier for you to view your health information - all in one secure location - when and where you need it. We expect MyChart will enhance the quality of care and service we provide.  When you register for MyChart, you can:    View your test results.    Request appointments and receive appointment reminders via email.    Request medication renewals.    View your medical history, allergies, medications and immunizations.    Communicate with your physician's office through a password-protected site.    Conveniently print information such as your medication lists.  To find out if MyChart is right for you, please talk to a member of our clinical staff today. We will gladly answer your questions about this free health and wellness tool.  If you are age 30 or older and want a member of your family to have access to your record, you must provide written consent by completing a proxy form available at our office. Please speak to our clinical staff about guidelines regarding accounts for patients younger than age 44.  As you activate your MyChart account and need any technical assistance, please call the MyChart technical support line at [phone]) or email your question to [email or web address]. If you email your question(s), please include your name, a return phone number and the best time to reach you.  If you have non-urgent  health-related questions, you can send a message to our office through MyChart at [email or web address]. If you have a medical emergency, call 911.  Thank you for using MyChart as your new health and wellness resource!   MyChart licensed from [employer],  [id number]. Patents Pending.

## 2013-02-20 ENCOUNTER — Other Ambulatory Visit: Payer: Self-pay | Admitting: Internal Medicine

## 2013-02-20 LAB — CELIAC PANEL 10
Endomysial Screen: NEGATIVE
IgA: 217 mg/dL (ref 69–380)
Tissue Transglutaminase Ab, IgA: 4.5 U/mL (ref <20)

## 2013-02-22 ENCOUNTER — Telehealth: Payer: Self-pay | Admitting: Internal Medicine

## 2013-02-22 NOTE — Telephone Encounter (Signed)
Results are still pending as labs are "in process". Pt notified.

## 2013-02-22 NOTE — Telephone Encounter (Signed)
New Problem:    Patient called in wanting to know the results of her latest Urinalysis.  Please call back.

## 2013-02-23 LAB — 5 HIAA, QUANTITATIVE, URINE, 24 HOUR: 5-HIAA, 24 Hr Urine: 1.1 mg/24 h (ref <=6.0)

## 2013-02-27 ENCOUNTER — Encounter: Payer: Self-pay | Admitting: Internal Medicine

## 2013-02-27 ENCOUNTER — Ambulatory Visit (INDEPENDENT_AMBULATORY_CARE_PROVIDER_SITE_OTHER): Payer: 59 | Admitting: Internal Medicine

## 2013-02-27 VITALS — BP 113/76 | Ht 65.0 in | Wt 115.4 lb

## 2013-02-27 DIAGNOSIS — I498 Other specified cardiac arrhythmias: Secondary | ICD-10-CM

## 2013-02-27 DIAGNOSIS — R000 Tachycardia, unspecified: Secondary | ICD-10-CM

## 2013-02-27 NOTE — Patient Instructions (Addendum)
Your physician recommends that you schedule a follow-up appointment in: 8 weeks  

## 2013-02-27 NOTE — Progress Notes (Signed)
[facility] Patient Care Team: Maurice Small, MD as PCP - General (Family Medicine)   HPI  Kelly Fischer is a 32 y.o. 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 When I saw her first about 2 weeks ago her history was notable for being salt deplete  We tried on a low dose beta blocker. She took the  Inderal and had significant improvement in tachycardia and blood pressure.  GI symptoms have been a prominent part of this.  We undertook5HIAA testing for which there is a false positive  There is a plan to undertake EGD. She apparently had biliary duct dilatation. She's had diarrhea and has been treated with Imodium with some improvement in her symptoms.  She continues to struggle with a mental fog  She has sensation of fullness in her neck choking. There is no associated brackish taste. He has some shortness of breath.  She has improved her salt and water intake and overall her symptoms are improved. Her heart rate is much better on low-dose beta blocker.she is currently taking atenolol.  Out side office records reviewed     Past Medical History  Diagnosis Date  . Raynaud's disease   . PVC (premature ventricular contraction)   . Abnormal EKG     Prominent QRS voltage, short PR interval, ST-T wave abnormalities.   . Tachycardia   . GERD (gastroesophageal reflux disease)     Past Surgical History  Procedure Laterality Date  . Wisdom tooth extraction  2002    Current Outpatient Prescriptions  Medication Sig Dispense Refill  . loperamide (IMODIUM A-D) 2 MG tablet Take 2 mg by mouth every morning.      Marland Kitchen ALPRAZolam (XANAX) 0.25 MG tablet Take 1 tablet (0.25 mg total) by mouth at bedtime as needed for sleep.  30 tablet  0  . atenolol (TENORMIN) 25 MG tablet TAKE 1 TABLET BY MOUTH DAILY AS DIRECTED  30 tablet  0  . B Complex-C (B-COMPLEX WITH VITAMIN C) tablet Take 1 tablet by mouth daily.      . Calcium Carbonate-Vitamin D  (CALCIUM + D PO) Take 1 tablet by mouth daily.      Marland Kitchen diltiazem (CARDIZEM) 30 MG tablet Take 30 mg by mouth every 6 (six) hours as needed (for palpitations).       . Levonorgestrel (SKYLA) 13.5 MG IUD by Intrauterine route. Once every 3 years - implanted 01/08/2013      . NON FORMULARY Salt tablets twice daily      . ondansetron (ZOFRAN) 4 MG tablet Take 1 tab every 6 hours as needed for nausea.  30 tablet  1   No current facility-administered medications for this visit.    Allergies  Allergen Reactions  . Zithromax (Azithromycin) Rash    Review of Systems negative except from HPI and PMH  Physical Exam BP 113/76  Ht 5\' 5"  (1.651 m)  Wt 115 lb 6.4 oz (52.345 kg)  BMI 19.2 kg/m2 Well developed and nourished in no acute distress HENT normal Neck supple  Clear Regular rate and rhythm, no murmurs or gallops Abd-soft with active BS No Clubbing cyanosis edema Skin-warm and dry A & Oriented  Grossly normal sensory and motor function    Assessment and  Plan

## 2013-02-27 NOTE — Assessment & Plan Note (Addendum)
  She continues to manifest some signs of dysautonomia. Her sinus tachycardia is better. She continues to have significant GI symptoms. I have told her that I am not quite sure about the appropriateness for inappropriateness of further GI testing. This will be deferred to her and her GI physicians. We have discussed the importance of heat on decompensation for patients with dysautonomia.  In addition, I broached the question being disorders. She denied this being a concern. Her mother on the other hand mention that she had been seen as a teenager because of this issue. There was clearly tension between the 2 regarding this question. I suggest that it is important to deal with these issues in hopes of resolving her disautonomic symptoms  We spent about 50 minutes reviewing the above issues

## 2013-02-28 ENCOUNTER — Encounter (HOSPITAL_COMMUNITY): Payer: Self-pay | Admitting: *Deleted

## 2013-03-18 ENCOUNTER — Encounter (HOSPITAL_COMMUNITY): Payer: Self-pay | Admitting: Pharmacy Technician

## 2013-03-21 ENCOUNTER — Ambulatory Visit (HOSPITAL_COMMUNITY)
Admission: RE | Admit: 2013-03-21 | Discharge: 2013-03-21 | Disposition: A | Discharge status: Home / Self Care | Payer: 59 | Source: Ambulatory Visit | Attending: Gastroenterology | Admitting: Gastroenterology

## 2013-03-21 ENCOUNTER — Encounter (HOSPITAL_COMMUNITY)
Admission: RE | Disposition: A | Discharge status: Home / Self Care | Payer: Self-pay | Source: Ambulatory Visit | Attending: Gastroenterology

## 2013-03-21 ENCOUNTER — Ambulatory Visit (HOSPITAL_COMMUNITY): Payer: 59 | Admitting: Anesthesiology

## 2013-03-21 ENCOUNTER — Encounter (HOSPITAL_COMMUNITY): Payer: Self-pay | Admitting: Anesthesiology

## 2013-03-21 ENCOUNTER — Encounter (HOSPITAL_COMMUNITY): Payer: Self-pay

## 2013-03-21 DIAGNOSIS — Z79899 Other long term (current) drug therapy: Secondary | ICD-10-CM | POA: Insufficient documentation

## 2013-03-21 DIAGNOSIS — R630 Anorexia: Secondary | ICD-10-CM | POA: Insufficient documentation

## 2013-03-21 DIAGNOSIS — K838 Other specified diseases of biliary tract: Secondary | ICD-10-CM

## 2013-03-21 DIAGNOSIS — R110 Nausea: Secondary | ICD-10-CM | POA: Insufficient documentation

## 2013-03-21 DIAGNOSIS — K2990 Gastroduodenitis, unspecified, without bleeding: Secondary | ICD-10-CM

## 2013-03-21 DIAGNOSIS — R109 Unspecified abdominal pain: Secondary | ICD-10-CM | POA: Insufficient documentation

## 2013-03-21 DIAGNOSIS — R933 Abnormal findings on diagnostic imaging of other parts of digestive tract: Secondary | ICD-10-CM | POA: Insufficient documentation

## 2013-03-21 DIAGNOSIS — R932 Abnormal findings on diagnostic imaging of liver and biliary tract: Secondary | ICD-10-CM

## 2013-03-21 DIAGNOSIS — K2970 Gastritis, unspecified, without bleeding: Secondary | ICD-10-CM

## 2013-03-21 DIAGNOSIS — G909 Disorder of the autonomic nervous system, unspecified: Secondary | ICD-10-CM | POA: Insufficient documentation

## 2013-03-21 DIAGNOSIS — K219 Gastro-esophageal reflux disease without esophagitis: Secondary | ICD-10-CM | POA: Insufficient documentation

## 2013-03-21 HISTORY — PX: EUS: SHX5427

## 2013-03-21 HISTORY — DX: Familial dysautonomia (riley-day): G90.1

## 2013-03-21 HISTORY — DX: Raynaud's syndrome without gangrene: I73.00

## 2013-03-21 SURGERY — UPPER ENDOSCOPIC ULTRASOUND (EUS) LINEAR
Anesthesia: Monitor Anesthesia Care

## 2013-03-21 MED ORDER — BUTAMBEN-TETRACAINE-BENZOCAINE 2-2-14 % EX AERO
INHALATION_SPRAY | CUTANEOUS | Status: DC | PRN
  Administered 2013-03-21: 2 via TOPICAL

## 2013-03-21 MED ORDER — SODIUM CHLORIDE 0.9 % IV SOLN: INTRAVENOUS | Status: DC

## 2013-03-21 MED ORDER — PROPOFOL 10 MG/ML IV EMUL
INTRAVENOUS | Status: DC | PRN
  Administered 2013-03-21: 140 ug/kg/min via INTRAVENOUS

## 2013-03-21 MED ORDER — FENTANYL CITRATE 0.05 MG/ML IJ SOLN
INTRAMUSCULAR | Status: DC | PRN
  Administered 2013-03-21 (×2): 50 ug via INTRAVENOUS

## 2013-03-21 MED ORDER — LACTATED RINGERS IV SOLN
INTRAVENOUS | Status: DC
  Administered 2013-03-21: 10:00:00 via INTRAVENOUS

## 2013-03-21 NOTE — Anesthesia Postprocedure Evaluation (Signed)
Anesthesia Post Note  Patient: Kelly Fischer  Procedure(s) Performed: Procedure(s) (LRB): UPPER ENDOSCOPIC ULTRASOUND (EUS) LINEAR (N/A)  Anesthesia type: MAC  Patient location: PACU  Post pain: Pain level controlled  Post assessment: Post-op Vital signs reviewed  Last Vitals: BP 108/57  Temp(Src) 36.7 C (Oral)  Resp 11  Ht 5\' 5"  (1.651 m)  Wt 113 lb (51.256 kg)  BMI 18.8 kg/m2  SpO2 100%  LMP 12/29/2012  Post vital signs: Reviewed  Level of consciousness: awake  Complications: No apparent anesthesia complications

## 2013-03-21 NOTE — Transfer of Care (Signed)
Immediate Anesthesia Transfer of Care Note  Patient: Kelly Fischer  Procedure(s) Performed: Procedure(s): UPPER ENDOSCOPIC ULTRASOUND (EUS) LINEAR (N/A)  Patient Location: PACU and Endoscopy Unit  Anesthesia Type:MAC  Level of Consciousness: awake, alert  and patient cooperative  Airway & Oxygen Therapy: Patient Spontanous Breathing and Patient connected to nasal cannula oxygen  Post-op Assessment: Report given to PACU RN and Post -op Vital signs reviewed and stable  Post vital signs: Reviewed and stable  Complications: No apparent anesthesia complications

## 2013-03-21 NOTE — H&P (View-Only) (Signed)
HPI: This is a  very pleasant 32 year old woman whom I am meeting for the first time today. She was seen here in our office to 3 weeks ago by one of the extenders. 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  Starting January, rapid HR, papatations, also SOB for about a week.  CP, abdominal pains, losing weight.  Anorexia.    Dr. Graciela Husbands, dysautonomia diagnosed.  Can be from many causes.   Repeating 5 HIAA.  Has lost 8-10 pounds, but that plateued.  bloodwork 01/2013: normal cbc, normal crp, normal CMEt; urine metanephrines normal; 5HIAA urine elevated, currently being rechecked.  CT scan 01/2013 IMPRESSION: 1. Mild diffuse biliary ductal dilatation (cbd 9mm). Normal appearing gallbladder, and no etiology apparent by CT. Recommend correlationwith liver function tests; if abnormal, MRCP should be considered for further evaluation. 2. No other significant abnormality identified.  She has frequent stools, soft but formed.  She can have 3-4 BMs per day.  No nocturnal symptoms.  No blood in stool overtly.  Was very nauseas at first, now just anorexia.  Eating a lot of fruit.  NSAIDs rarely at all.  Rare caffeine.    Past Medical History  Diagnosis Date  . Raynaud's disease   . PVC (premature ventricular contraction)   . Abnormal EKG     Prominent QRS voltage, short PR interval, ST-T wave abnormalities.   . Tachycardia   . GERD (gastroesophageal reflux disease)     Past Surgical History  Procedure Laterality Date  . Wisdom tooth extraction  2002    Current Outpatient Prescriptions  Medication Sig Dispense Refill  . ALPRAZolam (XANAX) 0.25 MG tablet Take 1 tablet (0.25 mg total) by mouth at bedtime as needed for sleep.  30 tablet  0  . atenolol (TENORMIN) 25 MG tablet Take 25 mg by mouth daily.      . B Complex-C (B-COMPLEX WITH VITAMIN C) tablet Take 1 tablet by mouth daily.      . Calcium Carbonate-Vitamin D (CALCIUM + D PO) Take 1 tablet by mouth  daily.      Marland Kitchen diltiazem (CARDIZEM) 30 MG tablet Take 30 mg by mouth every 6 (six) hours as needed (for palpitations).       . Levonorgestrel (SKYLA) 13.5 MG IUD by Intrauterine route. Once every 3 years - implanted 01/08/2013      . NON FORMULARY Salt tablets twice daily      . ondansetron (ZOFRAN) 4 MG tablet Take 1 tab every 6 hours as needed for nausea.  30 tablet  1   No current facility-administered medications for this visit.    Allergies as of 02/19/2013 - Review Complete 02/19/2013  Allergen Reaction Noted  . Zithromax (azithromycin) Rash 08/05/2012    Family History  Problem Relation Age of Onset  . Heart disease Mother   . Lupus Maternal Grandmother   . Colon polyps Mother     History   Social History  . Marital Status: Married    Spouse Name: N/A    Number of Children: N/A  . Years of Education: N/A   Occupational History  . RN [employer]   Social History Main Topics  . Smoking status: Never Smoker   . Smokeless tobacco: Never Used  . Alcohol Use: No     Comment: occasional  . Drug Use: No  . Sexually Active: Not on file   Other Topics Concern  . Not on file   Social History  Narrative   There is no history of premature coronary artery disease in her parents or siblings. No first-degree relatives have pacemakers or other devices.      Physical Exam: BP 100/70  Pulse 78  Ht 5\' 5"  (1.651 m)  Wt 114 lb (51.71 kg)  BMI 18.97 kg/m2 Constitutional: generally well-appearing Psychiatric: alert and oriented x3 Abdomen: soft, nontender, nondistended, no obvious ascites, no peritoneal signs, normal bowel sounds     Assessment and plan: 32 y.o. female with more frequent than usual stools, dyspepsia, anorexia, r recent nausea, dilated bile duct on recent imaging test  Perhaps she has celiac sprue. Going to get lab testing done for that. The bile duct is slightly dilated and I would like to proceed with upper endoscopic ultrasound for that. At the same  time I will get a very good examination of her luminal upper GI tract. Perhaps she has H. pylori causing some of the symptoms. For now she is going to start a single Imodium once a day.

## 2013-03-21 NOTE — Op Note (Signed)
[facility] [address] [place], [address]   ENDOSCOPIC ULTRASOUND PROCEDURE REPORT  PATIENT: Kelly Fischer, Kelly Fischer  MR#: [id number] BIRTHDATE: 1981/09/19  GENDER: Female ENDOSCOPIST: Rachael Fee, MD REFERRED BY:  Duke Salvia, M.D. PROCEDURE DATE:  03/21/2013 PROCEDURE:   Upper EUS  , EGD with biopsy ASA CLASS:      Class I INDICATIONS:   nausea, anorexia, loose stools (normal CBC, CMET, celiac panel), imaging suggested dilated CBD. MEDICATIONS: MAC sedation, administered by CRNA  DESCRIPTION OF PROCEDURE:   After the risks benefits and alternatives of the procedure were  explained, informed consent was obtained. The patient was then placed in the left, lateral, decubitus postion and IV sedation was administered. Throughout the procedure, the patients blood pressure, pulse and oxygen saturations were monitored continuously.  Under direct visualization, the Pentax Radial EUS [id number]  endoscope was introduced through the mouth  and advanced to the second portion of the duodenum .  Water was used as necessary to provide an acoustic interface.  Upon completion of the imaging, water was removed and the patient was sent to the recovery room in satisfactory condition.   Endoscopic findings: 1. There was mild, non-specific gastritis, biopsied and sent to pathology. 2. UGI tract was otherwise normal  EUS findings: 1. Pancreas was normal 2. CBD, extrahepatic biliary tree was normal: non-dilated and no stones 3. Gallbladder was normal 4. No peripancreatic, periportal adenopathy 5. Limited views of liver, spleen, portal and splenic vessels were all normal  Impression: Mild gastritis was noted, biopsied.  If H. pylori seen then will start on appropriate antibiotics.  The examination, both luminal and EUS, was otherwise normal.  Recent acute, watery diarrheal illness was likely infectious, was self limited).  She will continue daily imodium for  now.   _______________________________ eSigned:  Rachael Fee, MD 03/21/2013 10:56 AM

## 2013-03-21 NOTE — Anesthesia Preprocedure Evaluation (Addendum)
Anesthesia Evaluation  Patient identified by MRN, date of birth, ID band Patient awake    Reviewed: Allergy & Precautions, H&P , NPO status , Patient's Chart, lab work & pertinent test results, reviewed documented beta blocker date and time   Airway Mallampati: II TM Distance: >3 FB Neck ROM: Full    Dental  (+) Dental Advisory Given and Teeth Intact   Pulmonary shortness of breath and with exertion,          Cardiovascular - Peripheral Vascular Disease + dysrhythmias Rhythm:Regular Rate:Normal     Neuro/Psych negative neurological ROS  negative psych ROS   GI/Hepatic negative GI ROS, Neg liver ROS, GERD-  Medicated,  Endo/Other  negative endocrine ROS  Renal/GU negative Renal ROS     Musculoskeletal negative musculoskeletal ROS (+)   Abdominal   Peds  Hematology negative hematology ROS (+)   Anesthesia Other Findings   Reproductive/Obstetrics negative OB ROS                          Anesthesia Physical Anesthesia Plan  ASA: II  Anesthesia Plan: MAC   Post-op Pain Management:    Induction: Intravenous  Airway Management Planned:   Additional Equipment:   Intra-op Plan:   Post-operative Plan:   Informed Consent: I have reviewed the patients History and Physical, chart, labs and discussed the procedure including the risks, benefits and alternatives for the proposed anesthesia with the patient or authorized representative who has indicated his/her understanding and acceptance.   Dental advisory given  Plan Discussed with: CRNA  Anesthesia Plan Comments:         Anesthesia Quick Evaluation

## 2013-03-21 NOTE — Preoperative (Signed)
Beta Blockers   Reason not to administer Beta Blockers:Not Applicable 

## 2013-03-21 NOTE — Interval H&P Note (Signed)
History and Physical Interval Note:  03/21/2013 10:14 AM  Kelly Fischer  has presented today for surgery, with the diagnosis of Dilation of biliary tract [576.8]  The various methods of treatment have been discussed with the patient and family. After consideration of risks, benefits and other options for treatment, the patient has consented to  Procedure(s): UPPER ENDOSCOPIC ULTRASOUND (EUS) LINEAR (N/A) as a surgical intervention .  The patient's history has been reviewed, patient examined, no change in status, stable for surgery.  I have reviewed the patient's chart and labs.  Questions were answered to the patient's satisfaction.     Rob Bunting

## 2013-03-22 ENCOUNTER — Encounter (HOSPITAL_COMMUNITY): Payer: Self-pay | Admitting: Gastroenterology

## 2013-03-26 ENCOUNTER — Other Ambulatory Visit: Payer: Self-pay | Admitting: Internal Medicine

## 2013-03-26 ENCOUNTER — Other Ambulatory Visit: Payer: Self-pay

## 2013-03-26 DIAGNOSIS — R109 Unspecified abdominal pain: Secondary | ICD-10-CM

## 2013-03-26 NOTE — Progress Notes (Signed)
You have been scheduled for a HIDA scan at 9 am (1st floor) on 04/19/13. Please arrive 15 minutes prior to your scheduled appointment on 04/19/13. Make certain not to have anything to eat or drink at least 6 hours prior to your test. Should this appointment date or time not work well for you, please call radiology scheduling at [phone].  _____________________________________________________________________ hepatobiliary (HIDA) scan is an imaging procedure used to diagnose problems in the liver, gallbladder and bile ducts. In the HIDA scan, a radioactive chemical or tracer is injected into a vein in your arm. The tracer is handled by the liver like bile. Bile is a fluid produced and excreted by your liver that helps your digestive system break down fats in the foods you eat. Bile is stored in your gallbladder and the gallbladder releases the bile when you eat a meal. A special nuclear medicine scanner (gamma camera) tracks the flow of the tracer from your liver into your gallbladder and small intestine.  During your HIDA scan  You'll be asked to change into a hospital gown before your HIDA scan begins. Your health care team will position you on a table, usually on your back. The radioactive tracer is then injected into a vein in your arm.The tracer travels through your bloodstream to your liver, where it's taken up by the bile-producing cells. The radioactive tracer travels with the bile from your liver into your gallbladder and through your bile ducts to your small intestine.You may feel some pressure while the radioactive tracer is injected into your vein. As you lie on the table, a special gamma camera is positioned over your abdomen taking pictures of the tracer as it moves through your body. The gamma camera takes pictures continually for about an hour. You'll need to keep still during the HIDA scan. This can become uncomfortable, but you may find that you can lessen the discomfort by taking deep breaths  and thinking about other things. Tell your health care team if you're uncomfortable. The radiologist will watch on a computer the progress of the radioactive tracer through your body. The HIDA scan may be stopped when the radioactive tracer is seen in the gallbladder and enters your small intestine. This typically takes about an hour. In some cases extra imaging will be performed if original images aren't satisfactory, if morphine is given to help visualize the gallbladder or if the medication CCK is given to look at the contraction of the gallbladder. This test typically takes 2 hours to complete. ________________________________________________________________________ Pt has been instructed and will call back with any questions or concerns

## 2013-03-27 NOTE — Telephone Encounter (Signed)
New problem    Pt accidentally took her Atenolol in the morning one day and felt very good and was wondering if she could start taking it that way. Please call pt.

## 2013-04-18 ENCOUNTER — Encounter: Payer: Self-pay | Admitting: Internal Medicine

## 2013-04-18 ENCOUNTER — Ambulatory Visit (INDEPENDENT_AMBULATORY_CARE_PROVIDER_SITE_OTHER): Payer: 59 | Admitting: Internal Medicine

## 2013-04-18 VITALS — BP 118/76 | HR 69 | Ht 65.0 in | Wt 117.8 lb

## 2013-04-18 DIAGNOSIS — F439 Reaction to severe stress, unspecified: Secondary | ICD-10-CM

## 2013-04-18 DIAGNOSIS — G909 Disorder of the autonomic nervous system, unspecified: Secondary | ICD-10-CM

## 2013-04-18 DIAGNOSIS — G901 Familial dysautonomia [Riley-Day]: Secondary | ICD-10-CM

## 2013-04-18 DIAGNOSIS — Z733 Stress, not elsewhere classified: Secondary | ICD-10-CM

## 2013-04-18 NOTE — Assessment & Plan Note (Addendum)
Presumed diagnosis of POTS. She is much improved on her beta blocker. She is taking it now the morning. She'll try when necessary in the evening as well  She'll be following up with at [facility] dysautonomic clinic

## 2013-04-18 NOTE — Progress Notes (Signed)
Patient Care Team: Marguarite Arbour, MD as PCP - General (Unknown Physician Specialty)   HPI  Kelly Fischer is a 32 y.o. female Seen in followup for presumed dysautonomia this is a GI symptoms. She has had some evidence of POTS which improved with blocker therapy.   We also talked about the issues of psychosocial stress which was somewhat contentious between her and her mom.  She went to the beach actually tolerated it. She felt really quite well. This is a 70 aforementioned issue.  She also tells me that there is a new autonomic neurologist at [facility] whose last name is  Purnell Shoemaker whom we will see to contact   Past Medical History  Diagnosis Date  . PVC (premature ventricular contraction)   . Abnormal EKG     Prominent QRS voltage, short PR interval, ST-T wave abnormalities.   . Tachycardia   . GERD (gastroesophageal reflux disease)   . Raynaud's disease   . Dysautonomia since jan 2014    bp increases  . Raynaud disease 1995    Past Surgical History  Procedure Laterality Date  . Wisdom tooth extraction  2002  . Eus N/A 03/21/2013    Procedure: UPPER ENDOSCOPIC ULTRASOUND (EUS) LINEAR;  Surgeon: Rachael Fee, MD;  Location: [facility];  Service: Endoscopy;  Laterality: N/A;    Current Outpatient Prescriptions  Medication Sig Dispense Refill  . ALPRAZolam (XANAX) 0.25 MG tablet Take 0.25 mg by mouth as needed for sleep.      Marland Kitchen atenolol (TENORMIN) 25 MG tablet Take 25 mg by mouth every evening.      Marland Kitchen atenolol (TENORMIN) 25 MG tablet TAKE 1 TABLET BY MOUTH DAILY AS DIRECTED  30 tablet  PRN  . B Complex-C (B-COMPLEX WITH VITAMIN C) tablet Take 1 tablet by mouth daily.      . calcium-vitamin D (OSCAL WITH D) 500-200 MG-UNIT per tablet Take 1 tablet by mouth daily.      . Levonorgestrel (SKYLA) 13.5 MG IUD by Intrauterine route. Once every 3 years - implanted 01/08/2013      . loperamide (IMODIUM A-D) 2 MG tablet Take 2 mg by mouth every morning.      . NON FORMULARY Take 1  tablet by mouth 2 (two) times daily. Salt tablets      . ondansetron (ZOFRAN) 4 MG tablet Take 1 tab every 6 hours as needed for nausea.  30 tablet  1   No current facility-administered medications for this visit.    Allergies  Allergen Reactions  . Zithromax (Azithromycin) Rash    Review of Systems negative except from HPI and PMH  Physical Exam BP 118/76  Pulse 69  Ht 5\' 5"  (1.651 m)  Wt 117 lb 12.8 oz (53.434 kg)  BMI 19.6 kg/m2  SpO2 99% Well developed and nourished in no acute distress HENT normal Neck supple with JVP-flat Clear Regular rate and rhythm, no murmurs or gallops Abd-soft with active BS No Clubbing cyanosis edema Skin-warm and dry A & Oriented  Grossly normal sensory and motor function     Assessment and  Plan

## 2013-04-18 NOTE — Assessment & Plan Note (Signed)
We have discussed this again and encouraged her to pursue healing and processing hin this area

## 2013-04-18 NOTE — Patient Instructions (Signed)
Your physician recommends that you schedule a follow-up appointment in: 4 months with Dr. Graciela Husbands.  Your physician recommends that you continue on your current medications as directed. Please refer to the Current Medication list given to you today.

## 2013-04-19 ENCOUNTER — Encounter (HOSPITAL_COMMUNITY)
Admission: RE | Admit: 2013-04-19 | Discharge: 2013-04-19 | Disposition: A | Discharge status: Home / Self Care | Payer: 59 | Source: Ambulatory Visit | Attending: Gastroenterology | Admitting: Gastroenterology

## 2013-04-19 DIAGNOSIS — R109 Unspecified abdominal pain: Secondary | ICD-10-CM | POA: Insufficient documentation

## 2013-04-19 MED ORDER — TECHNETIUM TC 99M MEBROFENIN IV KIT
5.5000 | PACK | Freq: Once | INTRAVENOUS | Status: AC | PRN
  Administered 2013-04-19: 6 via INTRAVENOUS

## 2013-04-24 ENCOUNTER — Telehealth: Payer: Self-pay | Admitting: Gastroenterology

## 2013-04-25 NOTE — Telephone Encounter (Signed)
Left message on machine to call back  

## 2013-04-26 NOTE — Telephone Encounter (Signed)
Left message on machine to call back  

## 2013-04-26 NOTE — Telephone Encounter (Signed)
Pt had questions about gastritis, pt questions were answered and info mailed to her home

## 2013-04-29 ENCOUNTER — Telehealth: Payer: Self-pay | Admitting: Gastroenterology

## 2013-04-29 NOTE — Telephone Encounter (Signed)
I called the pt and she was thankful for the information mailed

## 2013-06-19 ENCOUNTER — Other Ambulatory Visit: Payer: Self-pay

## 2013-06-19 ENCOUNTER — Ambulatory Visit: Payer: Self-pay | Admitting: Internal Medicine

## 2013-06-19 LAB — HCG, QUANTITATIVE, PREGNANCY: Beta Hcg, Quant.: <1 m[IU]/mL — ABNORMAL LOW

## 2013-06-26 NOTE — Telephone Encounter (Signed)
SEE OTHER PHONE NOTE 

## 2013-08-01 ENCOUNTER — Other Ambulatory Visit: Payer: Self-pay

## 2013-08-08 ENCOUNTER — Encounter: Payer: Self-pay | Admitting: Internal Medicine

## 2013-08-08 ENCOUNTER — Ambulatory Visit (INDEPENDENT_AMBULATORY_CARE_PROVIDER_SITE_OTHER): Payer: 59 | Admitting: Internal Medicine

## 2013-08-08 VITALS — BP 120/84 | HR 89 | Ht 64.0 in | Wt 119.0 lb

## 2013-08-08 DIAGNOSIS — I498 Other specified cardiac arrhythmias: Secondary | ICD-10-CM

## 2013-08-08 DIAGNOSIS — I951 Orthostatic hypotension: Secondary | ICD-10-CM

## 2013-08-08 DIAGNOSIS — R000 Tachycardia, unspecified: Secondary | ICD-10-CM

## 2013-08-08 NOTE — Assessment & Plan Note (Signed)
We spent 20-30 minutes reviewing again dysautonomia sinus tachycardia and POTS. She finds it for a shorter shifts is helpful. She's had some problems with intermittent chest pain. I don't think is cardiac.  Stress management is improved.  Her beta blocker was switched from [employer].

## 2013-08-08 NOTE — Progress Notes (Signed)
Patient Care Team: Marguarite Arbour, MD as PCP - General (Unknown Physician Specialty)   HPI  Kelly Fischer is a 32 y.o. 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   We also talked about the issues of psychosocial stress which was somewhat contentious between her and her mom.   she has chosen not to go to [facility].  She's feeling overall a good deal better with less fatigue and change his beta blockers prescribed by her PCP.  She is thinking about going to graduate school.  Past Medical History  Diagnosis Date  . PVC (premature ventricular contraction)   . Abnormal EKG     Prominent QRS voltage, short PR interval, ST-T wave abnormalities.   . Tachycardia   . GERD (gastroesophageal reflux disease)   . Raynaud's disease   . Dysautonomia since jan 2014    bp increases  . Raynaud disease 1995    Past Surgical History  Procedure Laterality Date  . Wisdom tooth extraction  2002  . Eus N/A 03/21/2013    Procedure: UPPER ENDOSCOPIC ULTRASOUND (EUS) LINEAR;  Surgeon: Rachael Fee, MD;  Location: [facility];  Service: Endoscopy;  Laterality: N/A;    Current Outpatient Prescriptions  Medication Sig Dispense Refill  . B Complex-C (B-COMPLEX WITH VITAMIN C) tablet Take 1 tablet by mouth daily.      . calcium-vitamin D (OSCAL WITH D) 500-200 MG-UNIT per tablet Take 1 tablet by mouth daily.      Marland Kitchen loperamide (IMODIUM) 1 MG/5ML solution Take 1 mg by mouth daily.      . nebivolol (BYSTOLIC) 5 MG tablet Take 5 mg by mouth daily.      . NON FORMULARY Take 1 tablet by mouth 2 (two) times daily. Salt tablets      . ondansetron (ZOFRAN) 4 MG tablet Take 1 tab every 6 hours as needed for nausea.  30 tablet  1   No current facility-administered medications for this visit.    Allergies  Allergen Reactions  . Zithromax [Azithromycin] Rash    Review of Systems negative  except from HPI and PMH  Physical Exam BP 120/84  Pulse 89  Ht 5\' 4"  (1.626 m)  Wt 119 lb (53.978 kg)  BMI 20.42 kg/m2 Well developed and nourished in no acute distress HENT normal Neck supple with JVP-flat Clear Regular rate and rhythm, no murmurs or gallops Abd-soft with active BS No Clubbing cyanosis edema Skin-warm and dry A & Oriented  Grossly normal sensory and motor function     Assessment and  Plan

## 2013-08-08 NOTE — Patient Instructions (Addendum)
Your physician wants you to follow-up in: 6 months with Dr. Klein. You will receive a reminder letter in the mail two months in advance. If you don't receive a letter, please call our office to schedule the follow-up appointment.  Your physician recommends that you continue on your current medications as directed. Please refer to the Current Medication list given to you today.  

## 2013-09-05 ENCOUNTER — Telehealth: Payer: Self-pay | Admitting: Internal Medicine

## 2013-09-05 NOTE — Telephone Encounter (Signed)
I explained to patient that I would follow up with Dr. Graciela Husbands next week about this. Patient agreeable to plan and verbalized understanding.

## 2013-09-05 NOTE — Telephone Encounter (Signed)
New Message   Want referral to ob/gyn Dr Velva Harman sees pt by referral--This is someone Dr Graciela Husbands told her about.

## 2013-09-09 NOTE — Telephone Encounter (Signed)
Left message on personal answering machine that I would put in OBGYN referral on Wednesday per Dr. Graciela Husbands. I told her to call with any further issues/questions/concerns.

## 2013-09-11 ENCOUNTER — Other Ambulatory Visit: Payer: Self-pay | Admitting: *Deleted

## 2013-09-13 ENCOUNTER — Telehealth: Payer: Self-pay | Admitting: Internal Medicine

## 2013-09-13 ENCOUNTER — Other Ambulatory Visit: Payer: Self-pay | Admitting: *Deleted

## 2013-09-13 DIAGNOSIS — G901 Familial dysautonomia [Riley-Day]: Secondary | ICD-10-CM

## 2013-09-13 NOTE — Telephone Encounter (Signed)
Patient would like referral to GYN Dr. Milton Ferguson. Any questions please call patient

## 2013-09-13 NOTE — Telephone Encounter (Signed)
Explained that referral was sent this morning.

## 2013-10-03 ENCOUNTER — Other Ambulatory Visit: Payer: Self-pay

## 2013-12-18 ENCOUNTER — Other Ambulatory Visit: Payer: Self-pay | Admitting: Obstetrics and Gynecology

## 2014-02-11 ENCOUNTER — Ambulatory Visit: Payer: 59 | Admitting: Internal Medicine

## 2014-03-03 ENCOUNTER — Encounter: Payer: Self-pay | Admitting: Internal Medicine

## 2014-03-03 ENCOUNTER — Ambulatory Visit (INDEPENDENT_AMBULATORY_CARE_PROVIDER_SITE_OTHER): Payer: 59 | Admitting: Internal Medicine

## 2014-03-03 VITALS — BP 111/70 | HR 79 | Ht 65.0 in | Wt 122.0 lb

## 2014-03-03 DIAGNOSIS — G901 Familial dysautonomia [Riley-Day]: Secondary | ICD-10-CM

## 2014-03-03 DIAGNOSIS — G909 Disorder of the autonomic nervous system, unspecified: Secondary | ICD-10-CM

## 2014-03-03 NOTE — Progress Notes (Signed)
      Patient Care Team: Idelle Crouch, MD as PCP - General (Unknown Physician Specialty)   HPI  Kelly Fischer is a 33 y.o. 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  We also talked about the issues of psychosocial stress which was somewhat contentious between her and her mom.  she has chosen not to go to [facility].  She's feeling overall a good deal better with less fatigue and change his beta blockers prescribed by her PCP.  She has occasional palpitations. She has little tachycardia. She is little dyspnea.    Past Medical History  Diagnosis Date  . PVC (premature ventricular contraction)   . Abnormal EKG     Prominent QRS voltage, short PR interval, ST-T wave abnormalities.   . Tachycardia   . GERD (gastroesophageal reflux disease)   . Raynaud's disease   . Dysautonomia since jan 2014    bp increases  . Raynaud disease 1995    Past Surgical History  Procedure Laterality Date  . Wisdom tooth extraction  2002  . Eus N/A 03/21/2013    Procedure: UPPER ENDOSCOPIC ULTRASOUND (EUS) LINEAR;  Surgeon: Milus Banister, MD;  Location: [facility];  Service: Endoscopy;  Laterality: N/A;    Current Outpatient Prescriptions  Medication Sig Dispense Refill  . B Complex-C (B-COMPLEX WITH VITAMIN C) tablet Take 1 tablet by mouth daily.      . calcium-vitamin D (OSCAL WITH D) 500-200 MG-UNIT per tablet Take 1 tablet by mouth daily.      Marland Kitchen loperamide (IMODIUM) 1 MG/5ML solution Take 1 mg by mouth daily.      . nebivolol (BYSTOLIC) 5 MG tablet Take 5 mg by mouth daily.      . NON FORMULARY Take 1 tablet by mouth 2 (two) times daily. Salt tablets      . ondansetron (ZOFRAN) 4 MG tablet Take 1 tab every 6 hours as needed for nausea.  30 tablet  1   No current facility-administered medications for this visit.    Allergies  Allergen Reactions  . 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 We'll continue her on her salt supplementation and beta blockers. We'll plan in 6 months or so if her symptoms remain quiescent to back off on her Bystolic

## 2014-03-03 NOTE — Patient Instructions (Signed)
Your physician recommends that you continue on your current medications as directed. Please refer to the Current Medication list given to you today.  Your physician wants you to follow-up in: 6 months with Dr. Klein. You will receive a reminder letter in the mail two months in advance. If you don't receive a letter, please call our office to schedule the follow-up appointment.  

## 2014-03-11 ENCOUNTER — Other Ambulatory Visit: Payer: Self-pay

## 2014-06-25 ENCOUNTER — Telehealth: Payer: Self-pay | Admitting: Internal Medicine

## 2014-06-25 NOTE — Telephone Encounter (Signed)
New Prob    States HR and BP have been pretty low this week. Calling to speak to nurse regarding concerns. Please call.

## 2014-06-25 NOTE — Telephone Encounter (Signed)
Diagnosed with POTS last year (Feb 2014). Placed on beta blocker for HR/BP control. She is currently running a mile 3-4 days/week for the past couple months now. Pulse is maintaining 45-50s,  BPs running low around 100/40. Not symptomatic at this time. Advised to call us if symptoms arise. Will review with Dr. Caryl Comes for recommendations and call her. She is agreeable to this.

## 2014-06-27 ENCOUNTER — Other Ambulatory Visit: Payer: Self-pay | Admitting: *Deleted

## 2014-06-27 MED ORDER — NEBIVOLOL HCL 2.5 MG PO TABS: 2.5000 mg | ORAL_TABLET | Freq: Every day | ORAL | Status: DC

## 2014-06-27 NOTE — Telephone Encounter (Signed)
Left message to call back    (need to advise her to decrease Bystolic to 2.5 mg)

## 2014-06-27 NOTE — Telephone Encounter (Signed)
Follow up     Returned Sherri's call

## 2014-06-27 NOTE — Telephone Encounter (Signed)
Advised to decrease to 2.5 mg daily.  Rx sent to outpatient pharmacy per her request. Patient verbalized understanding and agreeable to plan.

## 2014-08-06 ENCOUNTER — Encounter: Payer: Self-pay | Admitting: Internal Medicine

## 2014-08-06 ENCOUNTER — Ambulatory Visit (INDEPENDENT_AMBULATORY_CARE_PROVIDER_SITE_OTHER): Payer: 59 | Admitting: Internal Medicine

## 2014-08-06 VITALS — BP 119/76 | HR 76 | Ht 65.0 in | Wt 124.2 lb

## 2014-08-06 DIAGNOSIS — G909 Disorder of the autonomic nervous system, unspecified: Secondary | ICD-10-CM

## 2014-08-06 DIAGNOSIS — G901 Familial dysautonomia [Riley-Day]: Secondary | ICD-10-CM

## 2014-08-06 DIAGNOSIS — I4949 Other premature depolarization: Secondary | ICD-10-CM

## 2014-08-06 DIAGNOSIS — I493 Ventricular premature depolarization: Secondary | ICD-10-CM

## 2014-08-06 NOTE — Patient Instructions (Signed)
Your physician recommends that you continue on your current medications as directed. Please refer to the Current Medication list given to you today.  Your physician wants you to follow-up in: 1 year with Dr. Klein.  You will receive a reminder letter in the mail two months in advance. If you don't receive a letter, please call our office to schedule the follow-up appointment.  

## 2014-08-06 NOTE — Progress Notes (Signed)
      Patient Care Team: Idelle Crouch, MD as PCP - General (Unknown Physician Specialty)   HPI  Kelly Fischer is a 33 y.o. 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  We also talked about the issues of psychosocial stress which was somewhat contentious between her and her mom.  she has chosen not to go to [facility].  She's feeling overall a good deal better with less fatigue and change his beta blockers prescribed by her PCP.  She has occasional palpitations. She has little tachycardia. She is little dyspnea.    Past Medical History  Diagnosis Date  . PVC (premature ventricular contraction)   . Abnormal EKG     Prominent QRS voltage, short PR interval, ST-T wave abnormalities.   . Tachycardia   . GERD (gastroesophageal reflux disease)   . Raynaud's disease   . Dysautonomia since jan 2014    bp increases  . Raynaud disease 1995    Past Surgical History  Procedure Laterality Date  . Wisdom tooth extraction  2002  . Eus N/A 03/21/2013    Procedure: UPPER ENDOSCOPIC ULTRASOUND (EUS) LINEAR;  Surgeon: Milus Banister, MD;  Location: [facility];  Service: Endoscopy;  Laterality: N/A;    Current Outpatient Prescriptions  Medication Sig Dispense Refill  . B Complex-C (B-COMPLEX WITH VITAMIN C) tablet Take 1 tablet by mouth daily.      Marland Kitchen CALCIUM-MAGNESIUM-VITAMIN D PO Take 1 tablet by mouth daily.      Marland Kitchen loperamide (IMODIUM) 1 MG/5ML solution Take 1 mg by mouth daily.      . nebivolol (BYSTOLIC) 2.5 MG tablet Take 1 tablet (2.5 mg total) by mouth daily.  30 tablet  2  . NON FORMULARY Take 1 tablet by mouth 2 (two) times daily. Salt tablets      . ondansetron (ZOFRAN) 4 MG tablet Take 1 tab every 6 hours as needed for nausea.  30 tablet  1   No current facility-administered medications for this visit.    Allergies  Allergen Reactions  . 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 We'll continue her on her salt supplementation and beta blockers. We will continue her on low dose bystolic She will continue to exercise

## 2014-10-08 ENCOUNTER — Other Ambulatory Visit: Payer: Self-pay | Admitting: Internal Medicine

## 2014-10-09 ENCOUNTER — Other Ambulatory Visit: Payer: Self-pay

## 2014-10-09 ENCOUNTER — Other Ambulatory Visit: Payer: Self-pay | Admitting: *Deleted

## 2015-01-20 ENCOUNTER — Other Ambulatory Visit: Payer: Self-pay | Admitting: Obstetrics and Gynecology

## 2015-01-21 LAB — CYTOLOGY - PAP

## 2015-06-23 ENCOUNTER — Ambulatory Visit (INDEPENDENT_AMBULATORY_CARE_PROVIDER_SITE_OTHER): Payer: 59

## 2015-06-23 ENCOUNTER — Ambulatory Visit (INDEPENDENT_AMBULATORY_CARE_PROVIDER_SITE_OTHER): Payer: 59 | Admitting: Podiatry

## 2015-06-23 ENCOUNTER — Encounter: Payer: Self-pay | Admitting: Podiatry

## 2015-06-23 VITALS — BP 111/71 | HR 69 | Resp 15 | Ht 64.0 in | Wt 127.4 lb

## 2015-06-23 DIAGNOSIS — M79672 Pain in left foot: Secondary | ICD-10-CM

## 2015-06-23 DIAGNOSIS — M898X9 Other specified disorders of bone, unspecified site: Secondary | ICD-10-CM

## 2015-06-23 DIAGNOSIS — M79671 Pain in right foot: Secondary | ICD-10-CM | POA: Diagnosis not present

## 2015-06-23 DIAGNOSIS — M2010 Hallux valgus (acquired), unspecified foot: Secondary | ICD-10-CM

## 2015-06-23 DIAGNOSIS — G5790 Unspecified mononeuropathy of unspecified lower limb: Secondary | ICD-10-CM | POA: Diagnosis not present

## 2015-06-23 NOTE — Progress Notes (Signed)
   Subjective:    Patient ID: Kelly Fischer, female    DOB: [birth year], 34 y.o.   MRN: [id number]  HPI Patient presents with bilateral foot pain, great toes, medial side that radiates down foot. Pt stated, "waking up with a stabbing pain". This has been going on for past 2 years. The pain is worse at the end of the day. 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 Pt also stated, "have venous insufficiency".   Review of Systems  Eyes: Positive for redness.  Cardiovascular: Positive for palpitations.  Musculoskeletal: Positive for back pain.  Allergic/Immunologic: Positive for environmental allergies.  Neurological: Positive for dizziness and numbness.  All other systems reviewed and are negative.      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 She does relate wearing old tennis shoes. She states that the shooting pain never radiates proximally only distally. She has no pain or numbness across the dorsal aspect of her foot.  I reviewed her past medical history medications allergy surgery social history and review of systems area pulses are strongly palpable bilateral. Neurologic sensorium is intact her Semmes-Weinstein monofilament. She does have some pain on palpation of the medial dorsal cutaneous nerve as it courses around the dorsomedial aspect of the first metatarsophalangeal joint bilaterally. Left seems to be worse than the right. She also has some tenderness on palpation of the deep peroneal nerve bilateral foot. Deep tendon reflexes are intact and brisk bilateral. Muscle strength +5 over 5 dorsiflexion plantar flexors and inverters everters all intrinsic musculature is intact. Orthopedic evaluation was resolved joints  distal to the ankle for range of motion without crepitus with exception of an increase in the first intermetatarsal angle greater than normal values on radiographs consistent with hallux abductovalgus deformity. At this point I see no signs of osteoarthritic changes and she does have a good full range of motion on physical exam. She does have mild tenderness on palpation of the medial aspect of the first metatarsophalangeal joint bilateral left greater than right consistent with hallux abductovalgus deformity and neuritis. Radiograph also demonstrates a small dorsally located exostosis bilateral foot overlying the second metatarsal intermediate cuneiform base.        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  Plan: Discussed etiology pathology conservative versus surgical therapies. At this point I recommended wider shoe gear and for her to keep her shoe gear relatively new particularly for her type of job duty. I will follow-up with her as needed. We did discuss the need for surgical intervention regarding the bunions at some point in time she understands this and is amenable to it when necessary.

## 2015-08-11 ENCOUNTER — Encounter: Payer: Self-pay | Admitting: Internal Medicine

## 2015-08-11 ENCOUNTER — Ambulatory Visit (INDEPENDENT_AMBULATORY_CARE_PROVIDER_SITE_OTHER): Payer: 59 | Admitting: Internal Medicine

## 2015-08-11 VITALS — BP 116/66 | HR 67 | Ht 65.0 in | Wt 127.2 lb

## 2015-08-11 DIAGNOSIS — G909 Disorder of the autonomic nervous system, unspecified: Secondary | ICD-10-CM | POA: Diagnosis not present

## 2015-08-11 DIAGNOSIS — I471 Supraventricular tachycardia: Secondary | ICD-10-CM

## 2015-08-11 DIAGNOSIS — R000 Tachycardia, unspecified: Secondary | ICD-10-CM

## 2015-08-11 DIAGNOSIS — I4711 Inappropriate sinus tachycardia, so stated: Secondary | ICD-10-CM

## 2015-08-11 DIAGNOSIS — G901 Familial dysautonomia [Riley-Day]: Secondary | ICD-10-CM

## 2015-08-11 NOTE — Patient Instructions (Signed)
Medication Instructions: - no changes  Labwork: - none  Procedures/Testing: - none  Follow-Up: - Your physician wants you to follow-up in: 1 year with Dr. Klein. You will receive a reminder letter in the mail two months in advance. If you don't receive a letter, please call our office to schedule the follow-up appointment.  Any Additional Special Instructions Will Be Listed Below (If Applicable). - none  

## 2015-08-11 NOTE — Progress Notes (Signed)
      Patient Care Team: Idelle Crouch, MD as PCP - General (Unknown Physician Specialty)   HPI  Kelly Fischer is a 34 y.o. 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  We also talked about the issues of psychosocial stress which was somewhat contentious between her and her mom.  she has chosen not to go to [facility].  She's feeling overall a good deal better with less fatigue and change his beta blockers prescribed by her PCP.  She has occasional palpitations. She has little tachycardia. She is little dyspnea.    Past Medical History  Diagnosis Date  . PVC (premature ventricular contraction)   . Abnormal EKG     Prominent QRS voltage, short PR interval, ST-T wave abnormalities.   . Tachycardia   . GERD (gastroesophageal reflux disease)   . Raynaud's disease   . Dysautonomia since jan 2014    bp increases  . Raynaud disease 1995    Past Surgical History  Procedure Laterality Date  . Wisdom tooth extraction  2002  . Eus N/A 03/21/2013    Procedure: UPPER ENDOSCOPIC ULTRASOUND (EUS) LINEAR;  Surgeon: Milus Banister, MD;  Location: [facility];  Service: Endoscopy;  Laterality: N/A;    Current Outpatient Prescriptions  Medication Sig Dispense Refill  . B Complex-C (B-COMPLEX WITH VITAMIN C) tablet Take 1 tablet by mouth daily.    Marland Kitchen BYSTOLIC 2.5 MG tablet TAKE 1 TABLET BY MOUTH DAILY. 30 tablet 10  . CALCIUM-MAGNESIUM-VITAMIN D PO Take 1 tablet by mouth daily.    Marland Kitchen loperamide (IMODIUM) 1 MG/5ML solution Take 1 mg by mouth daily.    . magnesium oxide (MAGOX 400) 400 (241.3 MG) MG tablet Take 400 mg by mouth daily.     . NON FORMULARY Take 1 tablet by mouth 2 (two) times daily. Salt tablets    . ondansetron (ZOFRAN) 4 MG tablet Take 1 tab every 6 hours as needed for nausea. 30 tablet 1  . propranolol (INDERAL) 10 MG tablet Take 10 mg by mouth as needed  (palpitations).      No current facility-administered medications for this visit.    Allergies  Allergen Reactions  . 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 We'll continue her on her salt supplementation and beta blockers. We will continue her on low dose bystolic She will continue to exercise

## 2015-09-04 ENCOUNTER — Other Ambulatory Visit: Payer: Self-pay | Admitting: Internal Medicine

## 2016-02-10 DIAGNOSIS — N760 Acute vaginitis: Secondary | ICD-10-CM | POA: Diagnosis not present

## 2016-02-10 DIAGNOSIS — Z6820 Body mass index (BMI) 20.0-20.9, adult: Secondary | ICD-10-CM | POA: Diagnosis not present

## 2016-02-10 DIAGNOSIS — Z01419 Encounter for gynecological examination (general) (routine) without abnormal findings: Secondary | ICD-10-CM | POA: Diagnosis not present

## 2016-02-10 DIAGNOSIS — Z114 Encounter for screening for human immunodeficiency virus [HIV]: Secondary | ICD-10-CM | POA: Diagnosis not present

## 2016-02-10 DIAGNOSIS — Z1159 Encounter for screening for other viral diseases: Secondary | ICD-10-CM | POA: Diagnosis not present

## 2016-02-10 DIAGNOSIS — Z113 Encounter for screening for infections with a predominantly sexual mode of transmission: Secondary | ICD-10-CM | POA: Diagnosis not present

## 2016-06-03 ENCOUNTER — Other Ambulatory Visit: Payer: Self-pay

## 2016-06-03 MED ORDER — NEBIVOLOL HCL 2.5 MG PO TABS: 2.5000 mg | ORAL_TABLET | Freq: Every day | ORAL | Status: DC

## 2016-06-07 ENCOUNTER — Telehealth: Payer: Self-pay | Admitting: Internal Medicine

## 2016-06-07 MED ORDER — NEBIVOLOL HCL 2.5 MG PO TABS: ORAL_TABLET | ORAL | Status: DC

## 2016-06-07 NOTE — Telephone Encounter (Signed)
I called and spoke with the patient.  I advised her I will refill her bystolic for her.

## 2016-06-07 NOTE — Telephone Encounter (Signed)
New Message  Pt call requesting to speak with RN. Pt states she discuss with provider in last visit that if she was not having issues this year 2017 she would not have to come back in to be seen until 2018. Pt states she was unable to get her prescriptions and wanted to know if there was a way she did not have to be seen but could still get her meds refilled. Please call back to discuss

## 2016-06-24 DIAGNOSIS — H52223 Regular astigmatism, bilateral: Secondary | ICD-10-CM | POA: Diagnosis not present

## 2016-06-24 DIAGNOSIS — H5213 Myopia, bilateral: Secondary | ICD-10-CM | POA: Diagnosis not present

## 2016-07-01 DIAGNOSIS — L700 Acne vulgaris: Secondary | ICD-10-CM | POA: Diagnosis not present

## 2016-07-01 DIAGNOSIS — I341 Nonrheumatic mitral (valve) prolapse: Secondary | ICD-10-CM | POA: Diagnosis not present

## 2016-07-01 DIAGNOSIS — Z0000 Encounter for general adult medical examination without abnormal findings: Secondary | ICD-10-CM | POA: Diagnosis not present

## 2016-07-01 DIAGNOSIS — R002 Palpitations: Secondary | ICD-10-CM | POA: Diagnosis not present

## 2016-07-04 DIAGNOSIS — Z13220 Encounter for screening for lipoid disorders: Secondary | ICD-10-CM | POA: Diagnosis not present

## 2016-07-04 DIAGNOSIS — Z79899 Other long term (current) drug therapy: Secondary | ICD-10-CM | POA: Diagnosis not present

## 2016-07-04 DIAGNOSIS — R002 Palpitations: Secondary | ICD-10-CM | POA: Diagnosis not present

## 2016-07-04 DIAGNOSIS — E559 Vitamin D deficiency, unspecified: Secondary | ICD-10-CM | POA: Diagnosis not present

## 2016-07-04 DIAGNOSIS — Z1329 Encounter for screening for other suspected endocrine disorder: Secondary | ICD-10-CM | POA: Diagnosis not present

## 2016-07-05 DIAGNOSIS — D2272 Melanocytic nevi of left lower limb, including hip: Secondary | ICD-10-CM | POA: Diagnosis not present

## 2016-07-05 DIAGNOSIS — D2371 Other benign neoplasm of skin of right lower limb, including hip: Secondary | ICD-10-CM | POA: Diagnosis not present

## 2016-07-05 DIAGNOSIS — D224 Melanocytic nevi of scalp and neck: Secondary | ICD-10-CM | POA: Diagnosis not present

## 2016-07-05 DIAGNOSIS — L720 Epidermal cyst: Secondary | ICD-10-CM | POA: Diagnosis not present

## 2016-07-05 DIAGNOSIS — D2261 Melanocytic nevi of right upper limb, including shoulder: Secondary | ICD-10-CM | POA: Diagnosis not present

## 2016-07-05 DIAGNOSIS — D2372 Other benign neoplasm of skin of left lower limb, including hip: Secondary | ICD-10-CM | POA: Diagnosis not present

## 2016-07-05 DIAGNOSIS — D2271 Melanocytic nevi of right lower limb, including hip: Secondary | ICD-10-CM | POA: Diagnosis not present

## 2016-07-05 DIAGNOSIS — D225 Melanocytic nevi of trunk: Secondary | ICD-10-CM | POA: Diagnosis not present

## 2016-07-05 DIAGNOSIS — D2262 Melanocytic nevi of left upper limb, including shoulder: Secondary | ICD-10-CM | POA: Diagnosis not present

## 2017-02-13 DIAGNOSIS — Z6820 Body mass index (BMI) 20.0-20.9, adult: Secondary | ICD-10-CM | POA: Diagnosis not present

## 2017-02-13 DIAGNOSIS — Z01419 Encounter for gynecological examination (general) (routine) without abnormal findings: Secondary | ICD-10-CM | POA: Diagnosis not present

## 2017-02-13 DIAGNOSIS — N760 Acute vaginitis: Secondary | ICD-10-CM | POA: Diagnosis not present

## 2017-02-15 DIAGNOSIS — H5213 Myopia, bilateral: Secondary | ICD-10-CM | POA: Diagnosis not present

## 2017-04-25 DIAGNOSIS — K644 Residual hemorrhoidal skin tags: Secondary | ICD-10-CM | POA: Diagnosis not present

## 2017-05-23 ENCOUNTER — Other Ambulatory Visit: Payer: Self-pay | Admitting: Internal Medicine

## 2017-06-15 DIAGNOSIS — D2371 Other benign neoplasm of skin of right lower limb, including hip: Secondary | ICD-10-CM | POA: Diagnosis not present

## 2017-06-15 DIAGNOSIS — D225 Melanocytic nevi of trunk: Secondary | ICD-10-CM | POA: Diagnosis not present

## 2017-06-15 DIAGNOSIS — L700 Acne vulgaris: Secondary | ICD-10-CM | POA: Diagnosis not present

## 2017-06-15 DIAGNOSIS — D2272 Melanocytic nevi of left lower limb, including hip: Secondary | ICD-10-CM | POA: Diagnosis not present

## 2017-06-15 DIAGNOSIS — L812 Freckles: Secondary | ICD-10-CM | POA: Diagnosis not present

## 2017-08-31 DIAGNOSIS — R002 Palpitations: Secondary | ICD-10-CM | POA: Diagnosis not present

## 2017-08-31 DIAGNOSIS — I341 Nonrheumatic mitral (valve) prolapse: Secondary | ICD-10-CM | POA: Diagnosis not present

## 2017-08-31 DIAGNOSIS — Z13220 Encounter for screening for lipoid disorders: Secondary | ICD-10-CM | POA: Diagnosis not present

## 2017-08-31 DIAGNOSIS — Z0000 Encounter for general adult medical examination without abnormal findings: Secondary | ICD-10-CM | POA: Diagnosis not present

## 2017-08-31 DIAGNOSIS — Z79899 Other long term (current) drug therapy: Secondary | ICD-10-CM | POA: Diagnosis not present

## 2017-08-31 DIAGNOSIS — E559 Vitamin D deficiency, unspecified: Secondary | ICD-10-CM | POA: Diagnosis not present

## 2017-08-31 DIAGNOSIS — G909 Disorder of the autonomic nervous system, unspecified: Secondary | ICD-10-CM | POA: Diagnosis not present

## 2018-01-17 ENCOUNTER — Encounter (HOSPITAL_COMMUNITY): Payer: Self-pay | Admitting: Family Medicine

## 2018-01-17 ENCOUNTER — Ambulatory Visit (HOSPITAL_COMMUNITY)
Admission: EM | Admit: 2018-01-17 | Discharge: 2018-01-17 | Disposition: A | Discharge status: Home / Self Care | Payer: 59 | Attending: Family Medicine | Admitting: Family Medicine

## 2018-01-17 DIAGNOSIS — J069 Acute upper respiratory infection, unspecified: Secondary | ICD-10-CM

## 2018-01-17 DIAGNOSIS — R509 Fever, unspecified: Secondary | ICD-10-CM

## 2018-01-17 LAB — POCT INFECTIOUS MONO SCREEN: Mono Screen: NEGATIVE

## 2018-01-17 MED ORDER — CEFDINIR 300 MG PO CAPS: 300.0000 mg | ORAL_CAPSULE | Freq: Two times a day (BID) | ORAL | 0 refills | Status: DC

## 2018-01-17 NOTE — ED Triage Notes (Signed)
Symptoms for one week.  Patient has general aches, lethargy, sore throat and fever.

## 2018-01-22 NOTE — ED Provider Notes (Signed)
[place]   [id number] 01/17/18 Arrival Time: 0093  ASSESSMENT & PLAN:  1. Fever, unspecified   2. Upper respiratory tract infection, unspecified type     Meds ordered this encounter  Medications  . cefdinir (OMNICEF) 300 MG capsule    Sig: Take 1 capsule (300 mg total) by mouth 2 (two) times daily.    Dispense:  20 capsule    Refill:  0   Given duration of symptoms will treat as above. Discussed typical duration of symptoms. OTC symptom care as needed. Ensure adequate fluid intake and rest. May f/u with PCP or here as needed.  Reviewed expectations re: course of current medical issues. Questions answered. Outlined signs and symptoms indicating need for more acute intervention. Patient verbalized understanding. After Visit Summary given.   SUBJECTIVE: History from: patient.  Kelly Fischer is a 37 y.o. female who presents with complaint of nasal congestion, post-nasal drainage, and a persistent dry cough. Onset abrupt, approximately 1 week ago. Overall fatigued with mild body aches. SOB: none. Wheezing: none. Fever: yes, at onset. Overall normal PO intake without n/v. OTC treatment: Tylenol and Mucinex without much relief.  Received flu shot this year: yes.  Social History   Tobacco Use  Smoking Status Never Smoker  Smokeless Tobacco Never Used    ROS: As per HPI.   OBJECTIVE:  Vitals:   01/17/18 1425  BP: 127/74  Pulse: 83  Resp: 18  Temp: 98.9 F (37.2 C)  TempSrc: Oral  SpO2: 100%     General appearance: alert; appears fatigued HEENT: nasal congestion; clear runny nose; throat irritation secondary to post-nasal drainage Neck: supple without LAD Lungs: unlabored respirations, symmetrical air entry; cough: mild; no respiratory distress Skin: warm and dry Psychological: alert and cooperative; normal mood and affect  Allergies  Allergen Reactions  . Zithromax [Azithromycin] Rash    Past Medical History:  Diagnosis Date  .  Abnormal EKG    Prominent QRS voltage, short PR interval, ST-T wave abnormalities.   . Dysautonomia ([place]) since jan 2014   bp increases  . GERD (gastroesophageal reflux disease)   . PVC (premature ventricular contraction)   . Raynaud disease 1995  . Raynaud's disease   . Tachycardia    Family History  Problem Relation Age of Onset  . Heart disease Mother   . Colon polyps Mother   . Lupus Maternal Grandmother    Social History   Socioeconomic History  . Marital status: Married    Spouse name: Not on file  . Number of children: Not on file  . Years of education: Not on file  . Highest education level: Not on file  Social Needs  . Financial resource strain: Not on file  . Food insecurity - worry: Not on file  . Food insecurity - inability: Not on file  . Transportation needs - medical: Not on file  . Transportation needs - non-medical: Not on file  Occupational History  . Occupation: Programmer, multimedia: [employer]  Tobacco Use  . Smoking status: Never Smoker  . Smokeless tobacco: Never Used  Substance and Sexual Activity  . Alcohol use: Yes    Comment: occasional  . Drug use: No  . Sexual activity: Not on file  Other Topics Concern  . Not on file  Social History Narrative   There is no history of premature coronary artery disease in her parents or siblings. No first-degree relatives have pacemakers or other devices.  Vanessa Kick, MD 01/22/18 [phone]

## 2018-02-22 DIAGNOSIS — Z6820 Body mass index (BMI) 20.0-20.9, adult: Secondary | ICD-10-CM | POA: Diagnosis not present

## 2018-02-22 DIAGNOSIS — Z01419 Encounter for gynecological examination (general) (routine) without abnormal findings: Secondary | ICD-10-CM | POA: Diagnosis not present

## 2018-09-03 DIAGNOSIS — Z0000 Encounter for general adult medical examination without abnormal findings: Secondary | ICD-10-CM | POA: Diagnosis not present

## 2018-09-03 DIAGNOSIS — I341 Nonrheumatic mitral (valve) prolapse: Secondary | ICD-10-CM | POA: Diagnosis not present

## 2018-09-03 DIAGNOSIS — M2550 Pain in unspecified joint: Secondary | ICD-10-CM | POA: Diagnosis not present

## 2018-09-03 DIAGNOSIS — Z13220 Encounter for screening for lipoid disorders: Secondary | ICD-10-CM | POA: Diagnosis not present

## 2018-09-03 DIAGNOSIS — R002 Palpitations: Secondary | ICD-10-CM | POA: Diagnosis not present

## 2018-09-03 DIAGNOSIS — G909 Disorder of the autonomic nervous system, unspecified: Secondary | ICD-10-CM | POA: Diagnosis not present

## 2018-09-03 DIAGNOSIS — Z79899 Other long term (current) drug therapy: Secondary | ICD-10-CM | POA: Diagnosis not present

## 2018-09-18 DIAGNOSIS — D2272 Melanocytic nevi of left lower limb, including hip: Secondary | ICD-10-CM | POA: Diagnosis not present

## 2018-09-18 DIAGNOSIS — D2372 Other benign neoplasm of skin of left lower limb, including hip: Secondary | ICD-10-CM | POA: Diagnosis not present

## 2018-09-18 DIAGNOSIS — D225 Melanocytic nevi of trunk: Secondary | ICD-10-CM | POA: Diagnosis not present

## 2018-09-18 DIAGNOSIS — D2371 Other benign neoplasm of skin of right lower limb, including hip: Secondary | ICD-10-CM | POA: Diagnosis not present

## 2018-12-18 ENCOUNTER — Telehealth: Payer: Self-pay | Admitting: Internal Medicine

## 2018-12-18 NOTE — Telephone Encounter (Signed)
Pt left a message on my VM that she got a letter to make an appt. She had thought she as as needed from 07-2015, where she was actually 1 year fu. Patient doing well with no issues, will come in if needed. Wants to keep Caryl Comes as a physician for future needs. Pls advise [phone]

## 2018-12-19 NOTE — Telephone Encounter (Signed)
Noted  

## 2019-01-22 ENCOUNTER — Encounter: Payer: Self-pay | Admitting: Internal Medicine

## 2019-11-29 NOTE — L&D Delivery Note (Signed)
Delivery Note At 11:18 PM a viable female was delivered via Vaginal, Spontaneous (Presentation:   Occiput Anterior).  APGAR: 8, 9; weight pending.   Placenta status: Spontaneous, Intact.  Cord: 3 vessels with the following complications: Tight nuchal x 1; delivered through.  Cord pH: n/a  Anesthesia: Epidural Episiotomy: None Lacerations:  Left vaginal and bilateral labial Suture Repair: 3.0 vicryl rapide Est. Blood Loss (mL):  200  Mom to postpartum.  Baby to Couplet care / Skin to Skin.  Linda Hedges 06/26/2020, 11:42 PM

## 2020-01-14 ENCOUNTER — Other Ambulatory Visit: Payer: Self-pay | Admitting: Otolaryngology

## 2020-01-14 DIAGNOSIS — G253 Myoclonus: Secondary | ICD-10-CM

## 2020-02-06 ENCOUNTER — Other Ambulatory Visit: Payer: Self-pay

## 2020-02-06 ENCOUNTER — Ambulatory Visit
Admission: RE | Admit: 2020-02-06 | Discharge: 2020-02-06 | Disposition: A | Discharge status: Home / Self Care | Payer: BC Managed Care – PPO | Source: Ambulatory Visit | Attending: Otolaryngology | Admitting: Otolaryngology

## 2020-02-06 DIAGNOSIS — G253 Myoclonus: Secondary | ICD-10-CM

## 2020-04-30 ENCOUNTER — Ambulatory Visit: Payer: BC Managed Care – PPO | Attending: Family

## 2020-04-30 DIAGNOSIS — Z23 Encounter for immunization: Secondary | ICD-10-CM

## 2020-05-04 ENCOUNTER — Telehealth: Payer: Self-pay | Admitting: Internal Medicine

## 2020-05-04 NOTE — Telephone Encounter (Signed)
Spoke with the patient who states that she got the COVID vaccine last week and ever since she has noticed an increase in her palpitations. She is pregnant and has some concerns so would like to be seen by Dr. Caryl Comes. I advised her that I would send a note to Dr. Olin Pia nurse and scheduler.

## 2020-05-04 NOTE — Telephone Encounter (Signed)
New Message    Pt is calling and says she understands it has been since 2016 since she was seen, she states Dr Caryl Comes says he would still see her, even if it has been some time  She says she is 8 months pregnant and has been experiencing Palpitations and is wondering if he will see her?   Please advise

## 2020-05-05 ENCOUNTER — Telehealth: Payer: Self-pay | Admitting: Internal Medicine

## 2020-05-05 NOTE — Telephone Encounter (Signed)
Attempted phone call to pt.  Left voicemail message to contact RN at [phone]. 

## 2020-05-05 NOTE — Telephone Encounter (Signed)
Patient returned call to [name]. Call successfully transferred to [name].  

## 2020-05-05 NOTE — Telephone Encounter (Signed)
Spoke with pt who states she has had an increase in palpitations since receiving her Covid vaccine.  Pt is currently pregnant and has not yet notified her OB this.  Pt reports she has an appt Thursday with her OB and will discuss at visit.  Requested pt also ask OB to place order for referral to EP, Dr Caryl Comes as pt has not been seen in 4 years.   Pt verbalizes understanding and agrees with current plan.

## 2020-05-06 ENCOUNTER — Telehealth: Payer: Self-pay

## 2020-05-06 NOTE — Telephone Encounter (Signed)
Pt states OB refuses to give referral for pt to see Dr Caryl Comes and states if she is a pt then it should not matter how long it has been since she was last seen.  Pt advised RN will discuss with management to obtain appointment and will contact pt as soon as appointment is secured.  Pt verbalizes understanding and agrees with current plan.

## 2020-05-06 NOTE — Telephone Encounter (Signed)
Spoke with pt and advised appointment scheduled for Friday, 05/15/2020 at 340pm with Dr Caryl Comes at the [place] office.  Pt verbalizes understanding and agrees with current plan.

## 2020-05-15 ENCOUNTER — Ambulatory Visit: Payer: BC Managed Care – PPO | Admitting: Internal Medicine

## 2020-05-15 ENCOUNTER — Other Ambulatory Visit: Payer: Self-pay

## 2020-05-15 ENCOUNTER — Other Ambulatory Visit
Admission: RE | Admit: 2020-05-15 | Discharge: 2020-05-15 | Disposition: A | Discharge status: Home / Self Care | Payer: BC Managed Care – PPO | Source: Ambulatory Visit | Attending: Internal Medicine | Admitting: Internal Medicine

## 2020-05-15 ENCOUNTER — Encounter: Payer: Self-pay | Admitting: Internal Medicine

## 2020-05-15 VITALS — BP 110/68 | HR 90 | Ht 66.0 in | Wt 156.0 lb

## 2020-05-15 DIAGNOSIS — G901 Familial dysautonomia [Riley-Day]: Secondary | ICD-10-CM | POA: Diagnosis not present

## 2020-05-15 DIAGNOSIS — R002 Palpitations: Secondary | ICD-10-CM

## 2020-05-15 LAB — TROPONIN I (HIGH SENSITIVITY): Troponin I (High Sensitivity): 3 ng/L (ref <18)

## 2020-05-15 NOTE — Patient Instructions (Signed)
Medication Instructions:  - Your physician recommends that you continue on your current medications as directed. Please refer to the Current Medication list given to you today.  *If you need a refill on your cardiac medications before your next appointment, please call your pharmacy*   Lab Work: - Your physician recommends that you have lab work today: Troponin  If you have labs (blood work) drawn today and your tests are completely normal, you will receive your results only by: Marland Kitchen MyChart Message (if you have MyChart) OR . A paper copy in the mail If you have any lab test that is abnormal or we need to change your treatment, we will call you to review the results.   Testing/Procedures: - none ordered   Follow-Up: At [facility], you and your health needs are our priority.  As part of our continuing mission to provide you with exceptional heart care, we have created designated Provider Care Teams.  These Care Teams include your primary Cardiologist (physician) and Advanced Practice Providers (APPs -  Physician Assistants and Nurse Practitioners) who all work together to provide you with the care you need, when you need it.  We recommend signing up for the patient portal called "MyChart".  Sign up information is provided on this After Visit Summary.  MyChart is used to connect with patients for Virtual Visits (Telemedicine).  Patients are able to view lab/test results, encounter notes, upcoming appointments, etc.  Non-urgent messages can be sent to your provider as well.   To learn more about what you can do with MyChart, go to [email or web address].    Your next appointment:   As needed   The format for your next appointment:   In Person  Provider:   Virl Axe, MD   Other Instructions n/a

## 2020-05-15 NOTE — Progress Notes (Signed)
ELECTROPHYSIOLOGY CONSULT NOTE  Patient ID: ADRIE Fischer, MRN: [id number], DOB/AGE: Apr 30, 1981 39 y.o. Admit date: (Not on file) Date of Consult: 05/15/2020  Primary Physician: Kelly Crouch, MD Primary Cardiologist:       Kelly Fischer is a 39 y.o. female who is being seen today for the evaluation of palpitations at the request of Dr Doy Hutching.    HPI Kelly Fischer is a 39 y.o. female previously seen for dysautonomia with evidence of pots/inappropriate sinus tachycardia for which she has been a long-term nebivolol and whom I last saw in 2016, first having seen in 2013.  She has done exceedingly well.  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  Now working at [employer].  She is [redacted] weeks pregnant and 2 weeks ago got her first Covid vaccine-Pfizer.  In the wake of that, she started developing frequent palpitations.  By palpation a sound like PVCs and that they are associated with repeated skips and sometimes associated with lightheadedness and shortness of breath and chest discomfort.  They seem to be gradually abating.  In the absence of the PVCs she has not noted any impact on her breathing status.  Echocardiogram 1/21-[facility] demonstrated normal LV function  Followed closely by obstetrics.  Did not have labs.  Past Medical History:  Diagnosis Date  . Abnormal EKG    Prominent QRS voltage, short PR interval, ST-T wave abnormalities.   . Dysautonomia ([place]) since jan 2014   bp increases  . GERD (gastroesophageal reflux disease)   . PVC (premature ventricular contraction)   . Raynaud disease 1995  . Raynaud's disease   . Tachycardia       Surgical History:  Past Surgical History:  Procedure Laterality Date  . EUS N/A 03/21/2013   Procedure: UPPER ENDOSCOPIC ULTRASOUND (EUS) LINEAR;  Surgeon: Milus Banister, MD;  Location: [facility];  Service: Endoscopy;  Laterality: N/A;  . WISDOM TOOTH EXTRACTION  2002     Home  Meds: Current Meds  Medication Sig  . B Complex-C (B-COMPLEX WITH VITAMIN C) tablet Take 1 tablet by mouth daily.  Marland Kitchen BYSTOLIC 2.5 MG tablet TAKE ONE TABLET (2.5 MG) BY MOUTH ONCE DAILY  . CALCIUM-MAGNESIUM-VITAMIN D PO Take 1 tablet by mouth daily.  . NON FORMULARY Take 1 tablet by mouth 2 (two) times daily. Salt tablets    Allergies:  Allergies  Allergen Reactions  . Zithromax [Azithromycin] Rash    Social History   Socioeconomic History  . Marital status: Married    Spouse name: Not on file  . Number of children: Not on file  . Years of education: Not on file  . Highest education level: Not on file  Occupational History  . Occupation: Programmer, multimedia: [employer]  Tobacco Use  . Smoking status: Never Smoker  . Smokeless tobacco: Never Used  Substance and Sexual Activity  . Alcohol use: Yes    Comment: occasional  . Drug use: No  . Sexual activity: Not on file  Other Topics Concern  . Not on file  Social History Narrative   There is no history of premature coronary artery disease in her parents or siblings. No first-degree relatives have pacemakers or other devices.   Social Determinants of Health   Financial Resource Strain:   . Difficulty of Paying Living Expenses:   Food Insecurity:   . Worried About Charity fundraiser in the Last Year:   .  Ran Out of Food in the Last Year:   Transportation Needs:   . Film/video editor (Medical):   Marland Kitchen Lack of Transportation (Non-Medical):   Physical Activity:   . Days of Exercise per Week:   . Minutes of Exercise per Session:   Stress:   . Feeling of Stress :   Social Connections:   . Frequency of Communication with Friends and Family:   . Frequency of Social Gatherings with Friends and Family:   . Attends Religious Services:   . Active Member of Clubs or Organizations:   . Attends Archivist Meetings:   Marland Kitchen Marital Status:   Intimate Partner Violence:   . Fear of Current or Ex-Partner:   . Emotionally  Abused:   Marland Kitchen Physically Abused:   . Sexually Abused:      Family History  Problem Relation Age of Onset  . Heart disease Mother   . Colon polyps Mother   . Lupus Maternal Grandmother      ROS:  Please see the history of present illness.     All other systems reviewed and negative.    Physical Exam: Blood pressure 110/68, pulse 90, height 5\' 6"  (1.676 m), weight 156 lb (70.8 kg), SpO2 98 %. General: Well developed, well nourished female in no acute distress. Head: Normocephalic, atraumatic, sclera non-icteric, no xanthomas, nares are without discharge. EENT: normal  Lymph Nodes:  none Neck: Negative for carotid bruits. JVD not elevated. Back:without scoliosis kyphosis Lungs: Clear bilaterally to auscultation without wheezes, rales, or rhonchi. Breathing is unlabored. Heart: RRR with S1 S2. No murmur . No rubs, or gallops appreciated. Abdomen: gravid Msk:  Strength and tone appear normal for age. Extremities: No clubbing or cyanosis. No edema.  Distal pedal pulses are 2+ and equal bilaterally. Skin: Warm and Dry Neuro: Alert and oriented X 3. CN III-XII intact Grossly normal sensory and motor function . Psych:  Responds to questions appropriately with a normal affect.      Labs: Cardiac Enzymes No results for input(s): CKTOTAL, CKMB, TROPONINI in the last 72 hours. CBC Lab Results  Component Value Date   WBC 6.7 02/05/2013   HGB 13.0 02/05/2013   HCT 39.0 02/05/2013   MCV 88.6 02/05/2013   PLT 293.0 02/05/2013   PROTIME: No results for input(s): LABPROT, INR in the last 72 hours. Chemistry No results for input(s): NA, K, CL, CO2, BUN, CREATININE, CALCIUM, PROT, BILITOT, ALKPHOS, ALT, AST, GLUCOSE in the last 168 hours.  Invalid input(s): LABALBU Lipids No results found for: CHOL, HDL, LDLCALC, TRIG BNP No results found for: PROBNP Thyroid Function Tests: No results for input(s): TSH, T4TOTAL, T3FREE, THYROIDAB in the last 72 hours.  Invalid input(s):  FREET3 Miscellaneous Lab Results  Component Value Date   DDIMER <0.27 01/05/2013    Radiology/Studies:  No results found.  EKG: Sinus at 90 Intervals 11/05/33   Assessment and Plan:  Dysautonomia/inappropriate sinus tachycardia  ?  PVCs following Covid-Pfizer vaccine  Pregnancy-third trimester     The patient's dysautonomic symptoms have been largely quiescient on nebivolol therapy.  Would continue under the direction of obstetrics  The PVCs following the [employer] vaccine are a little bit concerning.  Doing some research with the patient, myocarditis is described in adolescents and young adults.  Not sure if this extends to 37 year old or not.  We will undertake a troponin to assess; a little bit reluctant to undertake an echo as pregnancy associated volumes may affect interpretation.  If the troponin  is strikingly abnormal then we will have to pursue this.  Was not able to find a literature that described arrhythmias following Covid Pfizer vaccine apart from the comment above that arrhythmias are seen with myocarditis in the post vaccine phase.  Have also suggested we use an alive core monitor to try to elucidate the mechanism of her palpitations; they certainly sound like PVCs       Virl Axe

## 2020-05-20 NOTE — Progress Notes (Signed)
   Covid-19 Vaccination Clinic  Name:  Kelly Fischer    MRN: [id number] DOB: December 12, 1980  05/20/2020  Ms. Kelly Fischer was observed post Covid-19 immunization for 15 minutes without incident. She was provided with Vaccine Information Sheet and instruction to access the V-Safe system.   Kelly Fischer was instructed to call 911 with any severe reactions post vaccine: Marland Kitchen Difficulty breathing  . Swelling of face and throat  . A fast heartbeat  . A bad rash all over body  . Dizziness and weakness   Immunizations Administered    Name Date Dose VIS Date Route   Pfizer COVID-19 Vaccine 04/30/2020  3:00 PM 0.3 mL 01/22/2019 Intramuscular   Manufacturer: [place]   Lot: [id number]   [place]: [address]-5

## 2020-05-26 ENCOUNTER — Ambulatory Visit: Payer: Self-pay

## 2020-05-27 ENCOUNTER — Telehealth: Payer: Self-pay | Admitting: Internal Medicine

## 2020-05-27 ENCOUNTER — Other Ambulatory Visit: Payer: Self-pay

## 2020-05-27 ENCOUNTER — Ambulatory Visit: Payer: BC Managed Care – PPO | Admitting: Internal Medicine

## 2020-05-27 ENCOUNTER — Encounter: Payer: Self-pay | Admitting: Internal Medicine

## 2020-05-27 VITALS — BP 119/65 | HR 57 | Ht 66.0 in | Wt 156.0 lb

## 2020-05-27 DIAGNOSIS — G901 Familial dysautonomia [Riley-Day]: Secondary | ICD-10-CM

## 2020-05-27 DIAGNOSIS — I493 Ventricular premature depolarization: Secondary | ICD-10-CM | POA: Diagnosis not present

## 2020-05-27 MED ORDER — NEBIVOLOL HCL 2.5 MG PO TABS: 2.5000 mg | ORAL_TABLET | Freq: Two times a day (BID) | ORAL | 3 refills | Status: DC

## 2020-05-27 NOTE — Telephone Encounter (Signed)
Appointment scheduled for pt to see Dr Caryl Comes today 05/27/2020 at 2pm.  Pt verbalizes understanding and agrees with current plan.

## 2020-05-27 NOTE — Progress Notes (Signed)
      Patient Care Team: Idelle Crouch, MD as PCP - General (Unknown Physician Specialty)   HPI  Kelly Fischer is a 39 y.o. female seen with increasing palpitations.  Last seen 2016 before 2 weeks ago for POTS/inappropriate sinus tachycardia for which she has been on long-term nebivolol.  She is pregnant and about 3 weeks ago got her Covid vaccine-Pfizer.  She then developed more problems with palpitations which sounded like PVC;  using an AliveCor monitor, PVCs in the pattern of trigeminy were confirmed.  Has taken increasing doses of nebivolol as needed to try to mitigate her symptoms.  With some success.  No clear triggers.   Records and Results Reviewed   Past Medical History:  Diagnosis Date   Abnormal EKG    Prominent QRS voltage, short PR interval, ST-T wave abnormalities.    Dysautonomia ([place]) since jan 2014   bp increases   GERD (gastroesophageal reflux disease)    PVC (premature ventricular contraction)    Raynaud disease 1995   Raynaud's disease    Tachycardia     Past Surgical History:  Procedure Laterality Date   EUS N/A 03/21/2013   Procedure: UPPER ENDOSCOPIC ULTRASOUND (EUS) LINEAR;  Surgeon: Milus Banister, MD;  Location: [facility];  Service: Endoscopy;  Laterality: N/A;   WISDOM TOOTH EXTRACTION  2002    Current Meds  Medication Sig   CALCIUM-MAGNESIUM-VITAMIN D PO Take 1 tablet by mouth daily.   NON FORMULARY Take 1 tablet by mouth 2 (two) times daily. Salt tablets   Prenatal MV-Min-Fe Fum-FA-DHA (PRENATAL+DHA PO) Take 1 tablet by mouth daily.   [DISCONTINUED] BYSTOLIC 2.5 MG tablet TAKE ONE TABLET (2.5 MG) BY MOUTH ONCE DAILY    Allergies  Allergen Reactions   Zithromax [Azithromycin] Rash      Review of Systems negative except from HPI and PMH  Physical Exam BP 119/65   Pulse (!) 57   Ht 5\' 6"  (1.676 m)   Wt 156 lb (70.8 kg)   SpO2 96%   BMI 25.18 kg/m  Well developed and well nourished in no acute distress HENT  normal E scleral and icterus clear Neck Supple JVP flat; carotids brisk and full Clear to ausculation  Regular rate and rhythm, no murmurs gallops or rub Gravid No clubbing cyanosis  Edema Alert and oriented, grossly normal motor and sensory function Skin Warm and Dry  ECG sinus PVCs with a right bundle inferior axis morphology  CrCl cannot be calculated (Patient's most recent lab result is older than the maximum 21 days allowed.).   Assessment and  Plan  PVCs-frequent right bundle inferior axis QRS duration possibly 140 ms  Pregnancy-33 weeks  Dysautonomia/sinus tachycardia-inappropriate   The patient is having symptomatic PVCs.  Occurring late now in her pregnancy.  She is appropriately adjusting her nebivolol and we will increase it from daily to twice daily.  Hopefully this will suffice in controlling her symptoms.  In the event that it does not alternative beta-blockers can be considered.  Sometimes antiarrhythmic like once these can be used.  We discussed also that sleeping on her right side A. tach be better tolerated.   Many times, with the delivery of the child, the PVCs abate.         Current medicines are reviewed at length with the patient today .  The patient does not  have concerns regarding medicines.

## 2020-05-27 NOTE — Telephone Encounter (Signed)
Patient c/o Palpitations:  High priority if patient c/o lightheadedness, shortness of breath, or chest pain  1) How long have you had palpitations/irregular HR/ Afib? Are you having the symptoms now? About a week  2) Are you currently experiencing lightheadedness, SOB or CP? All of them. When the patient has the palps she has all the sx  3) Do you have a history of afib (atrial fibrillation) or irregular heart rhythm? yes  4) Have you checked your BP or HR? (document readings if available): pt has not checked her BP, her HR fluctuates between 80-90  5) Are you experiencing any other symptoms? No   Pt sent Dr. Caryl Comes some PDFs of her EKGs via MyChart on Monday but has not heard anything from the office. She has been having palps more frequently and was hoping someone could see her today

## 2020-05-27 NOTE — Patient Instructions (Signed)
Medication Instructions:  Your physician has recommended you make the following change in your medication:   Increase your Bystolic 2.5mg  to 1 tablet twice daily.    Lab Work: None ordered.  If you have labs (blood work) drawn today and your tests are completely normal, you will receive your results only by: Marland Kitchen MyChart Message (if you have MyChart) OR . A paper copy in the mail If you have any lab test that is abnormal or we need to change your treatment, we will call you to review the results.   Testing/Procedures: None ordered.    Follow-Up: At [facility], you and your health needs are our priority.  As part of our continuing mission to provide you with exceptional heart care, we have created designated Provider Care Teams.  These Care Teams include your primary Cardiologist (physician) and Advanced Practice Providers (APPs -  Physician Assistants and Nurse Practitioners) who all work together to provide you with the care you need, when you need it.  We recommend signing up for the patient portal called "MyChart".  Sign up information is provided on this After Visit Summary.  MyChart is used to connect with patients for Virtual Visits (Telemedicine).  Patients are able to view lab/test results, encounter notes, upcoming appointments, etc.  Non-urgent messages can be sent to your provider as well.   To learn more about what you can do with MyChart, go to [email or web address].    Your next appointment:   Contact after the birth of your baby to schedule appointment.

## 2020-06-15 ENCOUNTER — Telehealth: Payer: Self-pay | Admitting: Internal Medicine

## 2020-06-15 MED ORDER — NEBIVOLOL HCL 2.5 MG PO TABS: 2.5000 mg | ORAL_TABLET | Freq: Two times a day (BID) | ORAL | 3 refills | Status: DC

## 2020-06-15 NOTE — Telephone Encounter (Signed)
**Note De-Identified Jhace Fennell Obfuscation** The pts plan will not cover Nebivolol 2.5 mg BID as the normal dose is once daily. Quantity exception denied.  Will forward to Dr Caryl Comes and his nurse for advisement to the pt.

## 2020-06-15 NOTE — Telephone Encounter (Addendum)
**Note De-Identified Axle Parfait Obfuscation** Looks like refill was sent in incorrectly for # 90 tablets and should have been #180 as the pt takes Nebivolol 2.5 mg BID. I have corrected and resent to pharmacy to see if PA is still required.  I then called [place] and was advised that a quantity exception is needed as the pts ins prefers taking it once daily not BID.

## 2020-06-15 NOTE — Telephone Encounter (Signed)
    Pt c/o medication issue:  1. Name of Medication:   nebivolol (BYSTOLIC) 2.5 MG tablet    2. How are you currently taking this medication (dosage and times per day)? Take 1 tablet (2.5 mg total) by mouth in the morning and at bedtime.  3. Are you having a reaction (difficulty breathing--STAT)?   4. What is your medication issue? Pt is calling, she said per pharmacy they need prior auth for this medication since Dr. Caryl Comes increased it from 1 tablet daily to 2 tablets a days

## 2020-06-15 NOTE — Telephone Encounter (Signed)
Pt is calling stating that she needs a prior auth because Dr. Caryl Comes increased her medication. Jeani Hawking, LPN, can you please advise on this matter? Thanks

## 2020-06-16 MED ORDER — NEBIVOLOL HCL 5 MG PO TABS: 2.5000 mg | ORAL_TABLET | Freq: Two times a day (BID) | ORAL | 1 refills | Status: AC

## 2020-06-16 NOTE — Telephone Encounter (Signed)
**Note De-Identified Braydin Aloi Obfuscation** Per Dr Caryl Comes I have changed Nebivolol to 5 mg with instructions to take 1/2 tablet twice daily and sent to [place] to see if the pts insurance will cover this dose.

## 2020-06-17 NOTE — Telephone Encounter (Signed)
**Note De-Identified Melora Menon Obfuscation** I called [place] and s/w Heather who advised me that the pts ins provider is covering the pts Nebivolol at  5 mg-take 1/2 tablet PO BID. Per Nira Conn they have filled and notified the pt that her Nebivolol RX is ready to be picked up and that the cost for a 90 day supply is $90 and that the pt is ok with the cost.

## 2020-06-17 NOTE — Telephone Encounter (Signed)
utb

## 2020-06-24 ENCOUNTER — Encounter (HOSPITAL_COMMUNITY): Payer: Self-pay | Admitting: Obstetrics and Gynecology

## 2020-06-24 ENCOUNTER — Other Ambulatory Visit: Payer: Self-pay

## 2020-06-24 ENCOUNTER — Inpatient Hospital Stay (EMERGENCY_DEPARTMENT_HOSPITAL)
Admission: AD | Admit: 2020-06-24 | Discharge: 2020-06-25 | Disposition: A | Discharge status: Home / Self Care | Payer: BC Managed Care – PPO | Source: Home / Self Care | Attending: Obstetrics and Gynecology | Admitting: Obstetrics and Gynecology

## 2020-06-24 DIAGNOSIS — I7300 Raynaud's syndrome without gangrene: Secondary | ICD-10-CM | POA: Insufficient documentation

## 2020-06-24 DIAGNOSIS — O99891 Other specified diseases and conditions complicating pregnancy: Secondary | ICD-10-CM | POA: Diagnosis not present

## 2020-06-24 DIAGNOSIS — O99413 Diseases of the circulatory system complicating pregnancy, third trimester: Secondary | ICD-10-CM | POA: Insufficient documentation

## 2020-06-24 DIAGNOSIS — K219 Gastro-esophageal reflux disease without esophagitis: Secondary | ICD-10-CM | POA: Insufficient documentation

## 2020-06-24 DIAGNOSIS — O471 False labor at or after 37 completed weeks of gestation: Secondary | ICD-10-CM | POA: Insufficient documentation

## 2020-06-24 DIAGNOSIS — Z041 Encounter for examination and observation following transport accident: Secondary | ICD-10-CM | POA: Insufficient documentation

## 2020-06-24 DIAGNOSIS — Z881 Allergy status to other antibiotic agents status: Secondary | ICD-10-CM | POA: Insufficient documentation

## 2020-06-24 DIAGNOSIS — Z3A39 39 weeks gestation of pregnancy: Secondary | ICD-10-CM | POA: Insufficient documentation

## 2020-06-24 DIAGNOSIS — Z79899 Other long term (current) drug therapy: Secondary | ICD-10-CM | POA: Insufficient documentation

## 2020-06-24 DIAGNOSIS — O479 False labor, unspecified: Secondary | ICD-10-CM

## 2020-06-24 DIAGNOSIS — O99613 Diseases of the digestive system complicating pregnancy, third trimester: Secondary | ICD-10-CM | POA: Insufficient documentation
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
## 2020-06-24 NOTE — Progress Notes (Signed)
Baby active. Pt was unaware of some ctxs
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
## 2020-06-25 DIAGNOSIS — O99891 Other specified diseases and conditions complicating pregnancy: Secondary | ICD-10-CM | POA: Diagnosis not present

## 2020-06-25 DIAGNOSIS — Z3A39 39 weeks gestation of pregnancy: Secondary | ICD-10-CM | POA: Diagnosis not present

## 2020-06-25 DIAGNOSIS — O26893 Other specified pregnancy related conditions, third trimester: Secondary | ICD-10-CM | POA: Diagnosis not present

## 2020-06-25 NOTE — Progress Notes (Signed)
Hansel Feinstein CNM in to discuss d/c plan with pt. Written and verbal d/c instructions given and understanding voiced.

## 2020-06-25 NOTE — Discharge Instructions (Signed)
Labor and Vaginal Delivery  Vaginal delivery means that you give birth by pushing your baby out of your birth canal (vagina). A team of health care providers will help you before, during, and after vaginal delivery. Birth experiences are unique for every woman and every pregnancy, and birth experiences vary depending on where you choose to give birth. What happens when I arrive at the birth center or hospital? Once you are in labor and have been admitted into the hospital or birth center, your health care provider may:  Review your pregnancy history and any concerns that you have.  Insert an IV into one of your veins. This may be used to give you fluids and medicines.  Check your blood pressure, pulse, temperature, and heart rate (vital signs).  Check whether your bag of water (amniotic sac) has broken (ruptured).  Talk with you about your birth plan and discuss pain control options. Monitoring Your health care provider may monitor your contractions (uterine monitoring) and your baby's heart rate (fetal monitoring). You may need to be monitored:  Often, but not continuously (intermittently).  All the time or for long periods at a time (continuously). Continuous monitoring may be needed if: ? You are taking certain medicines, such as medicine to relieve pain or make your contractions stronger. ? You have pregnancy or labor complications. Monitoring may be done by:  Placing a special stethoscope or a handheld monitoring device on your abdomen to check your baby's heartbeat and to check for contractions.  Placing monitors on your abdomen (external monitors) to record your baby's heartbeat and the frequency and length of contractions.  Placing monitors inside your uterus through your vagina (internal monitors) to record your baby's heartbeat and the frequency, length, and strength of your contractions. Depending on the type of monitor, it may remain in your uterus or on your baby's head  until birth.  Telemetry. This is a type of continuous monitoring that can be done with external or internal monitors. Instead of having to stay in bed, you are able to move around during telemetry. Physical exam Your health care provider may perform frequent physical exams. This may include:  Checking how and where your baby is positioned in your uterus.  Checking your cervix to determine: ? Whether it is thinning out (effacing). ? Whether it is opening up (dilating). What happens during labor and delivery?  Normal labor and delivery is divided into the following three stages: Stage 1  This is the longest stage of labor.  This stage can last for hours or days.  Throughout this stage, you will feel contractions. Contractions generally feel mild, infrequent, and irregular at first. They get stronger, more frequent (about every 2-3 minutes), and more regular as you move through this stage.  This stage ends when your cervix is completely dilated to 4 inches (10 cm) and completely effaced. Stage 2  This stage starts once your cervix is completely effaced and dilated and lasts until the delivery of your baby.  This stage may last from 20 minutes to 2 hours.  This is the stage where you will feel an urge to push your baby out of your vagina.  You may feel stretching and burning pain, especially when the widest part of your baby's head passes through the vaginal opening (crowning).  Once your baby is delivered, the umbilical cord will be clamped and cut. This usually occurs after waiting a period of 1-2 minutes after delivery.  Your baby will be placed on your  bare chest (skin-to-skin contact) in an upright position and covered with a warm blanket. Watch your baby for feeding cues, like rooting or sucking, and help the baby to your breast for his or her first feeding. Stage 3  This stage starts immediately after the birth of your baby and ends after you deliver the placenta.  This  stage may take anywhere from 5 to 30 minutes.  After your baby has been delivered, you will feel contractions as your body expels the placenta and your uterus contracts to control bleeding. What can I expect after labor and delivery?  After labor is over, you and your baby will be monitored closely until you are ready to go home to ensure that you are both healthy. Your health care team will teach you how to care for yourself and your baby.  You and your baby will stay in the same room (rooming in) during your hospital stay. This will encourage early bonding and successful breastfeeding.  You may continue to receive fluids and medicines through an IV.  Your uterus will be checked and massaged regularly (fundal massage).  You will have some soreness and pain in your abdomen, vagina, and the area of skin between your vaginal opening and your anus (perineum).  If an incision was made near your vagina (episiotomy) or if you had some vaginal tearing during delivery, cold compresses may be placed on your episiotomy or your tear. This helps to reduce pain and swelling.  You may be given a squirt bottle to use instead of wiping when you go to the bathroom. To use the squirt bottle, follow these steps: ? Before you urinate, fill the squirt bottle with warm water. Do not use hot water. ? After you urinate, while you are sitting on the toilet, use the squirt bottle to rinse the area around your urethra and vaginal opening. This rinses away any urine and blood. ? Fill the squirt bottle with clean water every time you use the bathroom.  It is normal to have vaginal bleeding after delivery. Wear a sanitary pad for vaginal bleeding and discharge. Summary  Vaginal delivery means that you will give birth by pushing your baby out of your birth canal (vagina).  Your health care provider may monitor your contractions (uterine monitoring) and your baby's heart rate (fetal monitoring).  Your health care  provider may perform a physical exam.  Normal labor and delivery is divided into three stages.  After labor is over, you and your baby will be monitored closely until you are ready to go home. This information is not intended to replace advice given to you by your health care provider. Make sure you discuss any questions you have with your health care provider. Document Revised: 12/19/2017 Document Reviewed: 12/19/2017 Elsevier Patient Education  [place] of Pregnancy The third trimester is from week 28 through week 40 (months 7 through 9). The third trimester is a time when the unborn baby (fetus) is growing rapidly. At the end of the ninth month, the fetus is about 20 inches in length and weighs 6-10 pounds. Body changes during your third trimester Your body will continue to go through many changes during pregnancy. The changes vary from woman to woman. During the third trimester:  Your weight will continue to increase. You can expect to gain 25-35 pounds (11-16 kg) by the end of the pregnancy.  You may begin to get stretch marks on your hips, abdomen, and breasts.  You may  urinate more often because the fetus is moving lower into your pelvis and pressing on your bladder.  You may develop or continue to have heartburn. This is caused by increased hormones that slow down muscles in the digestive tract.  You may develop or continue to have constipation because increased hormones slow digestion and cause the muscles that push waste through your intestines to relax.  You may develop hemorrhoids. These are swollen veins (varicose veins) in the rectum that can itch or be painful.  You may develop swollen, bulging veins (varicose veins) in your legs.  You may have increased body aches in the pelvis, back, or thighs. This is due to weight gain and increased hormones that are relaxing your joints.  You may have changes in your hair. These can include thickening of  your hair, rapid growth, and changes in texture. Some women also have hair loss during or after pregnancy, or hair that feels dry or thin. Your hair will most likely return to normal after your baby is born.  Your breasts will continue to grow and they will continue to become tender. A yellow fluid (colostrum) may leak from your breasts. This is the first milk you are producing for your baby.  Your belly button may stick out.  You may notice more swelling in your hands, face, or ankles.  You may have increased tingling or numbness in your hands, arms, and legs. The skin on your belly may also feel numb.  You may feel short of breath because of your expanding uterus.  You may have more problems sleeping. This can be caused by the size of your belly, increased need to urinate, and an increase in your body's metabolism.  You may notice the fetus "dropping," or moving lower in your abdomen (lightening).  You may have increased vaginal discharge.  You may notice your joints feel loose and you may have pain around your pelvic bone. What to expect at prenatal visits You will have prenatal exams every 2 weeks until week 36. Then you will have weekly prenatal exams. During a routine prenatal visit:  You will be weighed to make sure you and the baby are growing normally.  Your blood pressure will be taken.  Your abdomen will be measured to track your baby's growth.  The fetal heartbeat will be listened to.  Any test results from the previous visit will be discussed.  You may have a cervical check near your due date to see if your cervix has softened or thinned (effaced).  You will be tested for Group B streptococcus. This happens between 35 and 37 weeks. Your health care provider may ask you:  What your birth plan is.  How you are feeling.  If you are feeling the baby move.  If you have had any abnormal symptoms, such as leaking fluid, bleeding, severe headaches, or abdominal  cramping.  If you are using any tobacco products, including cigarettes, chewing tobacco, and electronic cigarettes.  If you have any questions. Other tests or screenings that may be performed during your third trimester include:  Blood tests that check for low iron levels (anemia).  Fetal testing to check the health, activity level, and growth of the fetus. Testing is done if you have certain medical conditions or if there are problems during the pregnancy.  Nonstress test (NST). This test checks the health of your baby to make sure there are no signs of problems, such as the baby not getting enough oxygen. During this  test, a belt is placed around your belly. The baby is made to move, and its heart rate is monitored during movement. What is false labor? False labor is a condition in which you feel small, irregular tightenings of the muscles in the womb (contractions) that usually go away with rest, changing position, or drinking water. These are called Braxton Hicks contractions. Contractions may last for hours, days, or even weeks before true labor sets in. If contractions come at regular intervals, become more frequent, increase in intensity, or become painful, you should see your health care provider. What are the signs of labor?  Abdominal cramps.  Regular contractions that start at 10 minutes apart and become stronger and more frequent with time.  Contractions that start on the top of the uterus and spread down to the lower abdomen and back.  Increased pelvic pressure and dull back pain.  A watery or bloody mucus discharge that comes from the vagina.  Leaking of amniotic fluid. This is also known as your "water breaking." It could be a slow trickle or a gush. Let your health care provider know if it has a color or strange odor. If you have any of these signs, call your health care provider right away, even if it is before your due date. Follow these instructions at  home: Medicines  Follow your health care provider's instructions regarding medicine use. Specific medicines may be either safe or unsafe to take during pregnancy.  Take a prenatal vitamin that contains at least 600 micrograms (mcg) of folic acid.  If you develop constipation, try taking a stool softener if your health care provider approves. Eating and drinking   Eat a balanced diet that includes fresh fruits and vegetables, whole grains, good sources of protein such as meat, eggs, or tofu, and low-fat dairy. Your health care provider will help you determine the amount of weight gain that is right for you.  Avoid raw meat and uncooked cheese. These carry germs that can cause birth defects in the baby.  If you have low calcium intake from food, talk to your health care provider about whether you should take a daily calcium supplement.  Eat four or five small meals rather than three large meals a day.  Limit foods that are high in fat and processed sugars, such as fried and sweet foods.  To prevent constipation: ? Drink enough fluid to keep your urine clear or pale yellow. ? Eat foods that are high in fiber, such as fresh fruits and vegetables, whole grains, and beans. Activity  Exercise only as directed by your health care provider. Most women can continue their usual exercise routine during pregnancy. Try to exercise for 30 minutes at least 5 days a week. Stop exercising if you experience uterine contractions.  Avoid heavy lifting.  Do not exercise in extreme heat or humidity, or at high altitudes.  Wear low-heel, comfortable shoes.  Practice good posture.  You may continue to have sex unless your health care provider tells you otherwise. Relieving pain and discomfort  Take frequent breaks and rest with your legs elevated if you have leg cramps or low back pain.  Take warm sitz baths to soothe any pain or discomfort caused by hemorrhoids. Use hemorrhoid cream if your health  care provider approves.  Wear a good support bra to prevent discomfort from breast tenderness.  If you develop varicose veins: ? Wear support pantyhose or compression stockings as told by your healthcare provider. ? Elevate your feet for  15 minutes, 3-4 times a day. Prenatal care  Write down your questions. Take them to your prenatal visits.  Keep all your prenatal visits as told by your health care provider. This is important. Safety  Wear your seat belt at all times when driving.  Make a list of emergency phone numbers, including numbers for family, friends, the hospital, and police and fire departments. General instructions  Avoid cat litter boxes and soil used by cats. These carry germs that can cause birth defects in the baby. If you have a cat, ask someone to clean the litter box for you.  Do not travel far distances unless it is absolutely necessary and only with the approval of your health care provider.  Do not use hot tubs, steam rooms, or saunas.  Do not drink alcohol.  Do not use any products that contain nicotine or tobacco, such as cigarettes and e-cigarettes. If you need help quitting, ask your health care provider.  Do not use any medicinal herbs or unprescribed drugs. These chemicals affect the formation and growth of the baby.  Do not douche or use tampons or scented sanitary pads.  Do not cross your legs for long periods of time.  To prepare for the arrival of your baby: ? Take prenatal classes to understand, practice, and ask questions about labor and delivery. ? Make a trial run to the hospital. ? Visit the hospital and tour the maternity area. ? Arrange for maternity or paternity leave through employers. ? Arrange for family and friends to take care of pets while you are in the hospital. ? Purchase a rear-facing car seat and make sure you know how to install it in your car. ? Pack your hospital bag. ? Prepare the babys nursery. Make sure to remove all  pillows and stuffed animals from the baby's crib to prevent suffocation.  Visit your dentist if you have not gone during your pregnancy. Use a soft toothbrush to brush your teeth and be gentle when you floss. Contact a health care provider if:  You are unsure if you are in labor or if your water has broken.  You become dizzy.  You have mild pelvic cramps, pelvic pressure, or nagging pain in your abdominal area.  You have lower back pain.  You have persistent nausea, vomiting, or diarrhea.  You have an unusual or bad smelling vaginal discharge.  You have pain when you urinate. Get help right away if:  Your water breaks before 37 weeks.  You have regular contractions less than 5 minutes apart before 37 weeks.  You have a fever.  You are leaking fluid from your vagina.  You have spotting or bleeding from your vagina.  You have severe abdominal pain or cramping.  You have rapid weight loss or weight gain.  You have shortness of breath with chest pain.  You notice sudden or extreme swelling of your face, hands, ankles, feet, or legs.  Your baby makes fewer than 10 movements in 2 hours.  You have severe headaches that do not go away when you take medicine.  You have vision changes. Summary  The third trimester is from week 28 through week 40, months 7 through 9. The third trimester is a time when the unborn baby (fetus) is growing rapidly.  During the third trimester, your discomfort may increase as you and your baby continue to gain weight. You may have abdominal, leg, and back pain, sleeping problems, and an increased need to urinate.  During the  third trimester your breasts will keep growing and they will continue to become tender. A yellow fluid (colostrum) may leak from your breasts. This is the first milk you are producing for your baby.  False labor is a condition in which you feel small, irregular tightenings of the muscles in the womb (contractions) that  eventually go away. These are called Braxton Hicks contractions. Contractions may last for hours, days, or even weeks before true labor sets in.  Signs of labor can include: abdominal cramps; regular contractions that start at 10 minutes apart and become stronger and more frequent with time; watery or bloody mucus discharge that comes from the vagina; increased pelvic pressure and dull back pain; and leaking of amniotic fluid. This information is not intended to replace advice given to you by your health care provider. Make sure you discuss any questions you have with your health care provider. Document Revised: 03/07/2019 Document Reviewed: 12/20/2016 Elsevier Patient Education  [place].

## 2020-06-26 ENCOUNTER — Other Ambulatory Visit: Payer: Self-pay

## 2020-06-26 ENCOUNTER — Inpatient Hospital Stay (HOSPITAL_COMMUNITY)
Admission: AD | Admit: 2020-06-26 | Discharge: 2020-06-26 | Disposition: A | Discharge status: Home / Self Care | Payer: BC Managed Care – PPO | Source: Home / Self Care | Attending: Obstetrics & Gynecology | Admitting: Obstetrics & Gynecology

## 2020-06-26 ENCOUNTER — Encounter (HOSPITAL_COMMUNITY): Payer: Self-pay | Admitting: Obstetrics & Gynecology

## 2020-06-26 ENCOUNTER — Inpatient Hospital Stay (HOSPITAL_COMMUNITY): Payer: BC Managed Care – PPO | Admitting: Anesthesiology

## 2020-06-26 ENCOUNTER — Inpatient Hospital Stay (HOSPITAL_COMMUNITY)
Admission: AD | Admit: 2020-06-26 | Discharge: 2020-06-28 | DRG: 806 | Disposition: A | Discharge status: Home / Self Care | Payer: BC Managed Care – PPO | Attending: Obstetrics & Gynecology | Admitting: Obstetrics & Gynecology

## 2020-06-26 DIAGNOSIS — G901 Familial dysautonomia [Riley-Day]: Secondary | ICD-10-CM | POA: Diagnosis present

## 2020-06-26 DIAGNOSIS — Z3A39 39 weeks gestation of pregnancy: Secondary | ICD-10-CM

## 2020-06-26 DIAGNOSIS — O99354 Diseases of the nervous system complicating childbirth: Secondary | ICD-10-CM | POA: Diagnosis present

## 2020-06-26 DIAGNOSIS — O471 False labor at or after 37 completed weeks of gestation: Secondary | ICD-10-CM | POA: Insufficient documentation

## 2020-06-26 DIAGNOSIS — Z20822 Contact with and (suspected) exposure to covid-19: Secondary | ICD-10-CM | POA: Diagnosis present

## 2020-06-26 DIAGNOSIS — O479 False labor, unspecified: Secondary | ICD-10-CM

## 2020-06-26 DIAGNOSIS — O26893 Other specified pregnancy related conditions, third trimester: Secondary | ICD-10-CM | POA: Diagnosis present

## 2020-06-26 DIAGNOSIS — O99824 Streptococcus B carrier state complicating childbirth: Secondary | ICD-10-CM | POA: Diagnosis present

## 2020-06-26 DIAGNOSIS — Z3490 Encounter for supervision of normal pregnancy, unspecified, unspecified trimester: Secondary | ICD-10-CM

## 2020-06-26 LAB — TYPE AND SCREEN
ABO/RH(D): O POS
Antibody Screen: NEGATIVE

## 2020-06-26 LAB — CBC
HCT: 37.2 % (ref 36.0–46.0)
Hemoglobin: 12.4 g/dL (ref 12.0–15.0)
MCH: 30.4 pg (ref 26.0–34.0)
MCHC: 33.3 g/dL (ref 30.0–36.0)
MCV: 91.2 fL (ref 80.0–100.0)
Platelets: 241 10*3/uL (ref 150–400)
RBC: 4.08 MIL/uL (ref 3.87–5.11)
RDW: 13 % (ref 11.5–15.5)
WBC: 17.5 10*3/uL — ABNORMAL HIGH (ref 4.0–10.5)
nRBC: 0 % (ref 0.0–0.2)

## 2020-06-26 LAB — ABO/RH: ABO/RH(D): O POS

## 2020-06-26 LAB — SARS CORONAVIRUS 2 BY RT PCR (HOSPITAL ORDER, PERFORMED IN ~~LOC~~ HOSPITAL LAB): SARS Coronavirus 2: NEGATIVE

## 2020-06-26 MED ORDER — OXYCODONE-ACETAMINOPHEN 5-325 MG PO TABS: 1.0000 | ORAL_TABLET | ORAL | Status: DC | PRN

## 2020-06-26 MED ORDER — DIPHENHYDRAMINE HCL 50 MG/ML IJ SOLN: 12.5000 mg | INTRAMUSCULAR | Status: DC | PRN

## 2020-06-26 MED ORDER — OXYCODONE-ACETAMINOPHEN 5-325 MG PO TABS: 2.0000 | ORAL_TABLET | ORAL | Status: DC | PRN

## 2020-06-26 MED ORDER — PHENYLEPHRINE 40 MCG/ML (10ML) SYRINGE FOR IV PUSH (FOR BLOOD PRESSURE SUPPORT): 80.0000 ug | PREFILLED_SYRINGE | INTRAVENOUS | Status: DC | PRN

## 2020-06-26 MED ORDER — LIDOCAINE HCL (PF) 1 % IJ SOLN: 30.0000 mL | INTRAMUSCULAR | Status: DC | PRN

## 2020-06-26 MED ORDER — SODIUM CHLORIDE 0.9 % IV SOLN
5.0000 10*6.[IU] | Freq: Once | INTRAVENOUS | Status: AC
  Administered 2020-06-26: 5 10*6.[IU] via INTRAVENOUS
  Filled 2020-06-26: qty 5

## 2020-06-26 MED ORDER — SODIUM CHLORIDE (PF) 0.9 % IJ SOLN
INTRAMUSCULAR | Status: DC | PRN
  Administered 2020-06-26: 12 mL/h via EPIDURAL

## 2020-06-26 MED ORDER — EPHEDRINE 5 MG/ML INJ: 10.0000 mg | INTRAVENOUS | Status: DC | PRN

## 2020-06-26 MED ORDER — FENTANYL-BUPIVACAINE-NACL 0.5-0.125-0.9 MG/250ML-% EP SOLN: 12.0000 mL/h | EPIDURAL | Status: DC | PRN

## 2020-06-26 MED ORDER — ONDANSETRON HCL 4 MG/2ML IJ SOLN: 4.0000 mg | Freq: Four times a day (QID) | INTRAMUSCULAR | Status: DC | PRN

## 2020-06-26 MED ORDER — LIDOCAINE HCL (PF) 1 % IJ SOLN
INTRAMUSCULAR | Status: DC | PRN
  Administered 2020-06-26: 10 mL via EPIDURAL

## 2020-06-26 MED ORDER — LACTATED RINGERS IV SOLN: 500.0000 mL | Freq: Once | INTRAVENOUS | Status: DC

## 2020-06-26 MED ORDER — ACETAMINOPHEN 325 MG PO TABS: 650.0000 mg | ORAL_TABLET | ORAL | Status: DC | PRN

## 2020-06-26 MED ORDER — LACTATED RINGERS IV SOLN: 500.0000 mL | INTRAVENOUS | Status: DC | PRN

## 2020-06-26 MED ORDER — SOD CITRATE-CITRIC ACID 500-334 MG/5ML PO SOLN: 30.0000 mL | ORAL | Status: DC | PRN

## 2020-06-26 MED ORDER — LACTATED RINGERS IV SOLN: INTRAVENOUS | Status: DC

## 2020-06-26 MED ORDER — OXYTOCIN BOLUS FROM INFUSION
333.0000 mL | Freq: Once | INTRAVENOUS | Status: AC
  Administered 2020-06-26: 333 mL via INTRAVENOUS

## 2020-06-26 MED ORDER — FENTANYL-BUPIVACAINE-NACL 0.5-0.125-0.9 MG/250ML-% EP SOLN
EPIDURAL | Status: AC
  Filled 2020-06-26: qty 250

## 2020-06-26 MED ORDER — OXYTOCIN-SODIUM CHLORIDE 30-0.9 UT/500ML-% IV SOLN
2.5000 [IU]/h | INTRAVENOUS | Status: DC
  Filled 2020-06-26: qty 500

## 2020-06-26 MED ORDER — PENICILLIN G POT IN DEXTROSE 60000 UNIT/ML IV SOLN
3.0000 10*6.[IU] | INTRAVENOUS | Status: DC
  Administered 2020-06-26: 3 10*6.[IU] via INTRAVENOUS
  Filled 2020-06-26: qty 50

## 2020-06-26 NOTE — H&P (Signed)
Kelly Fischer is a 39 y.o. G1 at [redacted]w[redacted]d female presenting for labor.  Patient is AMA and had low risk NIPS.  She has h/o Dysautonomia which is well controlled with Bystolic.  GBS positive.  Currently comfortable with CLEA.  OB History    Gravida  1   Para      Term      Preterm      AB      Living        SAB      TAB      Ectopic      Multiple      Live Births             Past Medical History:  Diagnosis Date  . Abnormal EKG    Prominent QRS voltage, short PR interval, ST-T wave abnormalities.   . Dysautonomia ([place]) since jan 2014   bp increases  . GERD (gastroesophageal reflux disease)   . PVC (premature ventricular contraction)   . Raynaud disease 1995  . Raynaud's disease   . Tachycardia    Past Surgical History:  Procedure Laterality Date  . EUS N/A 03/21/2013   Procedure: UPPER ENDOSCOPIC ULTRASOUND (EUS) LINEAR;  Surgeon: Milus Banister, MD;  Location: [facility];  Service: Endoscopy;  Laterality: N/A;  . WISDOM TOOTH EXTRACTION  2002   Family History: family history includes Colon polyps in her mother; Heart disease in her mother; Lupus in her maternal grandmother. Social History:  reports that she has never smoked. She has never used smokeless tobacco. She reports current alcohol use. She reports that she does not use drugs.     Maternal Diabetes: No Genetic Screening: Normal Maternal Ultrasounds/Referrals: Normal Fetal Ultrasounds or other Referrals:  None Maternal Substance Abuse:  No Significant Maternal Medications:  Meds include: Other: Bystolic Significant Maternal Lab Results:  Group B Strep positive Other Comments:  None  Review of Systems Maternal Medical History:  Reason for admission: Contractions.   Contractions: Onset was 6-12 hours ago.   Frequency: regular.   Perceived severity is moderate.    Fetal activity: Perceived fetal activity is normal.   Last perceived fetal movement was within the past hour.    Prenatal  complications: no prenatal complications Prenatal Complications - Diabetes: none.    Dilation: 3 Effacement (%): 90 Exam by:: Ginger Richard Ritchey RN    SVE: 4/75/-2, AROM clear   Blood pressure 115/65, pulse 82, temperature 98 F (36.7 C), temperature source Oral, resp. rate 16, SpO2 100 %. Maternal Exam:  Uterine Assessment: Contraction strength is moderate.  Contraction frequency is regular.   Abdomen: Patient reports no abdominal tenderness. Fundal height is c/w dates.   Estimated fetal weight is 7#.   Fetal presentation: vertex  Introitus: Normal vulva. Amniotic fluid character: clear.  Pelvis: adequate for delivery.   Cervix: Cervix evaluated by digital exam.     Fetal Exam Fetal Monitor Review: Baseline rate: 135.  Variability: moderate (6-25 bpm).   Pattern: accelerations present and no decelerations.    Fetal State Assessment: Category I - tracings are normal.     Physical Exam Constitutional:      Appearance: Normal appearance.  HENT:     Head: Normocephalic and atraumatic.  Pulmonary:     Effort: Pulmonary effort is normal.  Abdominal:     Palpations: Abdomen is soft.  Genitourinary:    General: Normal vulva.  Musculoskeletal:        General: Normal range of motion.  Skin:  General: Skin is warm and dry.  Neurological:     Mental Status: She is alert.  Psychiatric:        Mood and Affect: Mood normal.        Behavior: Behavior normal.     Prenatal labs: ABO, Rh: --/--/O POS (07/30 1645) Antibody: NEG (07/30 1645) Rubella:  Immune RPR:   NR HBsAg:   Negative HIV:   NR GBS:   Positive  Assessment/Plan: 38yo G1 at [redacted]w[redacted]d with SOL -Augment with pitocin prn -PCN for GBS ppx -Anticipate NSVD   Linda Hedges 06/26/2020, 6:20 PM

## 2020-06-26 NOTE — MAU Note (Signed)
Contractions started last night.  No water leaking or bleeding. Was 2 cm in office yesterday.

## 2020-06-26 NOTE — Progress Notes (Signed)
Comfortable with CLEA SVE 6/90/-1 FHT Cat I Recheck in 2 hours  Linda Hedges, DO

## 2020-06-26 NOTE — Anesthesia Procedure Notes (Signed)
Epidural Patient location during procedure: OB Start time: 06/26/2020 5:15 PM End time: 06/26/2020 5:30 PM  Staffing Anesthesiologist: Lidia Collum, MD Performed: anesthesiologist   Preanesthetic Checklist Completed: patient identified, IV checked, risks and benefits discussed, monitors and equipment checked, pre-op evaluation and timeout performed  Epidural Patient position: sitting Prep: DuraPrep Patient monitoring: heart rate, continuous pulse ox and blood pressure Approach: midline Location: L4-L5 Injection technique: LOR air  Needle:  Needle type: Tuohy  Needle gauge: 17 G Needle length: 9 cm Needle insertion depth: 4 cm Catheter type: closed end flexible Catheter size: 19 Gauge Catheter at skin depth: 9 cm Test dose: negative  Assessment Events: blood not aspirated, injection not painful, no injection resistance, no paresthesia and negative IV test  Additional Notes Reason for block:procedure for pain

## 2020-06-26 NOTE — Discharge Instructions (Signed)
Fetal Movement Counts Patient Name: ________________________________________________ Patient Due Date: ____________________ What is a fetal movement count?  A fetal movement count is the number of times that you feel your baby move during a certain amount of time. This may also be called a fetal kick count. A fetal movement count is recommended for every pregnant woman. You may be asked to start counting fetal movements as early as week 28 of your pregnancy. Pay attention to when your baby is most active. You may notice your baby's sleep and wake cycles. You may also notice things that make your baby move more. You should do a fetal movement count:  When your baby is normally most active.  At the same time each day. A good time to count movements is while you are resting, after having something to eat and drink. How do I count fetal movements? 1. Find a quiet, comfortable area. Sit, or lie down on your side. 2. Write down the date, the start time and stop time, and the number of movements that you felt between those two times. Take this information with you to your health care visits. 3. Write down your start time when you feel the first movement. 4. Count kicks, flutters, swishes, rolls, and jabs. You should feel at least 10 movements. 5. You may stop counting after you have felt 10 movements, or if you have been counting for 2 hours. Write down the stop time. 6. If you do not feel 10 movements in 2 hours, contact your health care provider for further instructions. Your health care provider may want to do additional tests to assess your baby's well-being. Contact a health care provider if:  You feel fewer than 10 movements in 2 hours.  Your baby is not moving like he or she usually does. Date: ____________ Start time: ____________ Stop time: ____________ Movements: ____________ Date: ____________ Start time: ____________ Stop time: ____________ Movements: ____________ Date: ____________  Start time: ____________ Stop time: ____________ Movements: ____________ Date: ____________ Start time: ____________ Stop time: ____________ Movements: ____________ Date: ____________ Start time: ____________ Stop time: ____________ Movements: ____________ Date: ____________ Start time: ____________ Stop time: ____________ Movements: ____________ Date: ____________ Start time: ____________ Stop time: ____________ Movements: ____________ Date: ____________ Start time: ____________ Stop time: ____________ Movements: ____________ Date: ____________ Start time: ____________ Stop time: ____________ Movements: ____________ This information is not intended to replace advice given to you by your health care provider. Make sure you discuss any questions you have with your health care provider. Document Revised: 07/04/2019 Document Reviewed: 07/04/2019 Elsevier Patient Education  2020 [employer]  

## 2020-06-26 NOTE — Anesthesia Preprocedure Evaluation (Signed)
Anesthesia Evaluation  Patient identified by MRN, date of birth, ID band Patient awake    Reviewed: Allergy & Precautions, H&P , NPO status , Patient's Chart, lab work & pertinent test results  History of Anesthesia Complications Negative for: history of anesthetic complications  Airway Mallampati: II  TM Distance: >3 FB Neck ROM: full    Dental no notable dental hx.    Pulmonary neg pulmonary ROS,    Pulmonary exam normal        Cardiovascular Normal cardiovascular exam Rhythm:regular Rate:Normal  H/o dysautonomia w/ tachycardia/HTN, well controlled with bystolic   Neuro/Psych negative neurological ROS  negative psych ROS   GI/Hepatic Neg liver ROS, GERD  ,  Endo/Other  negative endocrine ROS  Renal/GU negative Renal ROS  negative genitourinary   Musculoskeletal   Abdominal   Peds  Hematology negative hematology ROS (+)   Anesthesia Other Findings  Raynaud's  Reproductive/Obstetrics (+) Pregnancy                             Anesthesia Physical Anesthesia Plan  ASA: II  Anesthesia Plan: Epidural   Post-op Pain Management:    Induction:   PONV Risk Score and Plan:   Airway Management Planned:   Additional Equipment:   Intra-op Plan:   Post-operative Plan:   Informed Consent: I have reviewed the patients History and Physical, chart, labs and discussed the procedure including the risks, benefits and alternatives for the proposed anesthesia with the patient or authorized representative who has indicated his/her understanding and acceptance.       Plan Discussed with:   Anesthesia Plan Comments:         Anesthesia Quick Evaluation

## 2020-06-27 ENCOUNTER — Encounter (HOSPITAL_COMMUNITY): Payer: Self-pay | Admitting: Obstetrics & Gynecology

## 2020-06-27 DIAGNOSIS — Z3490 Encounter for supervision of normal pregnancy, unspecified, unspecified trimester: Secondary | ICD-10-CM

## 2020-06-27 LAB — CBC
HCT: 36.1 % (ref 36.0–46.0)
Hemoglobin: 12.1 g/dL (ref 12.0–15.0)
MCH: 30.6 pg (ref 26.0–34.0)
MCHC: 33.5 g/dL (ref 30.0–36.0)
MCV: 91.4 fL (ref 80.0–100.0)
Platelets: 221 10*3/uL (ref 150–400)
RBC: 3.95 MIL/uL (ref 3.87–5.11)
RDW: 13 % (ref 11.5–15.5)
WBC: 21.1 10*3/uL — ABNORMAL HIGH (ref 4.0–10.5)
nRBC: 0 % (ref 0.0–0.2)

## 2020-06-27 LAB — RPR: RPR Ser Ql: NONREACTIVE

## 2020-06-27 MED ORDER — OXYCODONE-ACETAMINOPHEN 5-325 MG PO TABS: 1.0000 | ORAL_TABLET | ORAL | Status: DC | PRN

## 2020-06-27 MED ORDER — NEBIVOLOL HCL 2.5 MG PO TABS
2.5000 mg | ORAL_TABLET | Freq: Every day | ORAL | Status: DC
  Filled 2020-06-27 (×2): qty 1

## 2020-06-27 MED ORDER — WITCH HAZEL-GLYCERIN EX PADS: 1.0000 "application " | MEDICATED_PAD | CUTANEOUS | Status: DC | PRN

## 2020-06-27 MED ORDER — SIMETHICONE 80 MG PO CHEW: 80.0000 mg | CHEWABLE_TABLET | ORAL | Status: DC | PRN

## 2020-06-27 MED ORDER — ONDANSETRON HCL 4 MG/2ML IJ SOLN: 4.0000 mg | INTRAMUSCULAR | Status: DC | PRN

## 2020-06-27 MED ORDER — DIPHENHYDRAMINE HCL 25 MG PO CAPS: 25.0000 mg | ORAL_CAPSULE | Freq: Four times a day (QID) | ORAL | Status: DC | PRN

## 2020-06-27 MED ORDER — ONDANSETRON HCL 4 MG PO TABS: 4.0000 mg | ORAL_TABLET | ORAL | Status: DC | PRN

## 2020-06-27 MED ORDER — ACETAMINOPHEN 325 MG PO TABS: 650.0000 mg | ORAL_TABLET | ORAL | Status: DC | PRN

## 2020-06-27 MED ORDER — DIBUCAINE (PERIANAL) 1 % EX OINT: 1.0000 "application " | TOPICAL_OINTMENT | CUTANEOUS | Status: DC | PRN

## 2020-06-27 MED ORDER — ZOLPIDEM TARTRATE 5 MG PO TABS: 5.0000 mg | ORAL_TABLET | Freq: Every evening | ORAL | Status: DC | PRN

## 2020-06-27 MED ORDER — OXYCODONE-ACETAMINOPHEN 5-325 MG PO TABS: 2.0000 | ORAL_TABLET | ORAL | Status: DC | PRN

## 2020-06-27 MED ORDER — IBUPROFEN 600 MG PO TABS
600.0000 mg | ORAL_TABLET | Freq: Four times a day (QID) | ORAL | Status: DC
  Administered 2020-06-27 – 2020-06-28 (×5): 600 mg via ORAL
  Filled 2020-06-27 (×5): qty 1

## 2020-06-27 MED ORDER — PRENATAL MULTIVITAMIN CH
1.0000 | ORAL_TABLET | Freq: Every day | ORAL | Status: DC
  Filled 2020-06-27: qty 1

## 2020-06-27 MED ORDER — BENZOCAINE-MENTHOL 20-0.5 % EX AERO: 1.0000 "application " | INHALATION_SPRAY | CUTANEOUS | Status: DC | PRN

## 2020-06-27 MED ORDER — SENNOSIDES-DOCUSATE SODIUM 8.6-50 MG PO TABS: 2.0000 | ORAL_TABLET | ORAL | Status: DC

## 2020-06-27 MED ORDER — COCONUT OIL OIL: 1.0000 "application " | TOPICAL_OIL | Status: DC | PRN

## 2020-06-27 MED ORDER — TETANUS-DIPHTH-ACELL PERTUSSIS 5-2.5-18.5 LF-MCG/0.5 IM SUSP: 0.5000 mL | Freq: Once | INTRAMUSCULAR | Status: DC

## 2020-06-27 NOTE — Lactation Note (Signed)
This note was copied from a baby's chart. Lactation Consultation Note  Patient Name: Kelly Fischer RKYHC'W Date: 06/27/2020 Reason for consult: Initial assessment;Term  Initial assessment of 8 hours old baby Kelly Kelly Fischer) of a P1 mother eager to breastfeed. Baby is awake in basinet upon arrival and father is changing diaper (stool). Mother states she wants to latch baby since she has not fed for a while because she was sleeping. Parents asked about best ways to wake up baby. Encouraged parents to have baby skin to skin and/or unwrap baby and change diaper to wake her up.   Attempted modified cradle position, left breast. No latched attained, baby seemed fussy and disorganized. Reviewed hand expression, colostrum easily expressed and able to collect ~2 mL. Spoon-fed baby expressed colostrum. Set up support pillows for football position, right breast. Assisted with position and baby latched with ease. Observed rhythmic suckling, breast tissue movement and swallowing. Mother verbalized comfort with position and latch.   Discussed breastfeeding basics. Discussed milk coming to volume. Reviewed normal newborn behavior and expectations with parents.Reviewed colostrum benefits for baby. Reviewed with mother average size of a NB stomach. Encourage to follow babies' hunger and fullness cues. Reviewed importance to offer the breast 8 to 12 times in a 24-hour period for proper stimulation and to establish good milk supply.     Encouraged to contact [facility] for support when ready to breastfeed baby and recommended to request help for questions or concerns.    All questions answered at this time.    Maternal Data Formula Feeding for Exclusion: No Has patient been taught Hand Expression?: Yes Does the patient have breastfeeding experience prior to this delivery?: No  Feeding Feeding Type: Breast Fed  LATCH Score Latch: Grasps breast easily, tongue down, lips flanged, rhythmical sucking.  Audible  Swallowing: Spontaneous and intermittent  Type of Nipple: Everted at rest and after stimulation (L-invaginated)  Comfort (Breast/Nipple): Soft / non-tender  Hold (Positioning): Assistance needed to correctly position infant at breast and maintain latch.  LATCH Score: 9  Interventions Interventions: Breast feeding basics reviewed;Assisted with latch;Skin to skin;Hand express;Adjust position;Support pillows;Expressed milk  Lactation Tools Discussed/Used WIC Program: No   Consult Status Consult Status: Follow-up Date: 06/28/20 Follow-up type: In-patient    Rafael Quesada A Higuera Ancidey 06/27/2020, 10:43 AM

## 2020-06-27 NOTE — Anesthesia Postprocedure Evaluation (Signed)
Anesthesia Post Note  Patient: [name]  Procedure(s) Performed: AN AD HOC LABOR EPIDURAL     Patient location during evaluation: Mother Baby Anesthesia Type: Epidural Level of consciousness: awake and alert Pain management: pain level controlled Vital Signs Assessment: post-procedure vital signs reviewed and stable Respiratory status: spontaneous breathing, nonlabored ventilation and respiratory function stable Cardiovascular status: stable Postop Assessment: no headache, no backache and epidural receding Anesthetic complications: no   No complications documented.  Last Vitals:  Vitals:   06/27/20 0238 06/27/20 0640  BP: 109/65 (!) 107/61  Pulse: 61 63  Resp: 18 18  Temp: 36.6 C 36.9 C  SpO2: 100%     Last Pain:  Vitals:   06/27/20 0822  TempSrc:   PainSc: 0-No pain   Pain Goal:                Epidural/Spinal Function Cutaneous sensation: Normal sensation (06/27/20 5427), Patient able to flex knees: Yes (06/27/20 0623), Patient able to lift hips off bed: Yes (06/27/20 7628), Back pain beyond tenderness at insertion site: No (06/27/20 3151), Progressively worsening motor and/or sensory loss: No (06/27/20 7616), Bowel and/or bladder incontinence post epidural: No (06/27/20 0737)  Rayvon Char

## 2020-06-27 NOTE — Progress Notes (Addendum)
Post Partum Day 1 Subjective: no complaints, up ad lib, voiding and tolerating PO  Objective: Blood pressure (!) 107/61, pulse 63, temperature 98.5 F (36.9 C), temperature source Oral, resp. rate 18, SpO2 100 %, unknown if currently breastfeeding.  Physical Exam:  General: alert, cooperative and appears stated age Lochia: appropriate Uterine Fundus: firm Incision: healing well, no significant drainage, no dehiscence DVT Evaluation: No evidence of DVT seen on physical exam. Negative Homan's sign. No cords or calf tenderness.  Recent Labs    06/26/20 1641 06/27/20 0340  HGB 12.4 12.1  HCT 37.2 36.1    Assessment/Plan: Plan for discharge tomorrow and Breastfeeding Leukocytosis-on admit WBC 17.5 and 21.1 this am.  Will recheck CBC tomorrow am.   LOS: 1 day   Linda Hedges 06/27/2020, 8:06 AM

## 2020-06-28 LAB — CBC WITH DIFFERENTIAL/PLATELET
Abs Immature Granulocytes: 0.04 10*3/uL (ref 0.00–0.07)
Basophils Absolute: 0.1 10*3/uL (ref 0.0–0.1)
Basophils Relative: 0 %
Eosinophils Absolute: 0.1 10*3/uL (ref 0.0–0.5)
Eosinophils Relative: 1 %
HCT: 33.9 % — ABNORMAL LOW (ref 36.0–46.0)
Hemoglobin: 11.3 g/dL — ABNORMAL LOW (ref 12.0–15.0)
Immature Granulocytes: 0 %
Lymphocytes Relative: 21 %
Lymphs Abs: 2.7 10*3/uL (ref 0.7–4.0)
MCH: 30.9 pg (ref 26.0–34.0)
MCHC: 33.3 g/dL (ref 30.0–36.0)
MCV: 92.6 fL (ref 80.0–100.0)
Monocytes Absolute: 0.8 10*3/uL (ref 0.1–1.0)
Monocytes Relative: 6 %
Neutro Abs: 9.2 10*3/uL — ABNORMAL HIGH (ref 1.7–7.7)
Neutrophils Relative %: 72 %
Platelets: 203 10*3/uL (ref 150–400)
RBC: 3.66 MIL/uL — ABNORMAL LOW (ref 3.87–5.11)
RDW: 13.5 % (ref 11.5–15.5)
WBC: 13 10*3/uL — ABNORMAL HIGH (ref 4.0–10.5)
nRBC: 0 % (ref 0.0–0.2)

## 2020-06-28 MED ORDER — IBUPROFEN 600 MG PO TABS: 600.0000 mg | ORAL_TABLET | Freq: Four times a day (QID) | ORAL | 0 refills | Status: AC

## 2020-06-28 NOTE — Discharge Summary (Signed)
Postpartum Discharge Summary     Patient Name: Kelly Fischer DOB: 21-Jun-1981 MRN: [id number]  Date of admission: 06/26/2020 Delivery date:06/26/2020  Delivering provider: Linda Hedges  Date of discharge: 06/28/2020  Admitting diagnosis: Labor and delivery indication for care or intervention [O75.9] Pregnancy [Z34.90] Intrauterine pregnancy: [redacted]w[redacted]d    Secondary diagnosis:  Active Problems:   Labor and delivery indication for care or intervention   Pregnancy  Additional problems: dysautonomia    Discharge diagnosis: Term Pregnancy Delivered                                              Post partum procedures:none Augmentation: AROM Complications: None  Hospital course: Onset of Labor With Vaginal Delivery      39y.o. yo G1P1001 at 328w2das admitted in Latent Labor on 06/26/2020. Patient had an uncomplicated labor course as follows:  Membrane Rupture Time/Date: 5:54 PM ,06/26/2020   Delivery Method:Vaginal, Spontaneous  Episiotomy: None  Lacerations:  Labial;Vaginal  Patient had an uncomplicated postpartum course.  She is ambulating, tolerating a regular diet, passing flatus, and urinating well. Patient is discharged home in stable condition on 06/28/20.  Newborn Data: Birth date:06/26/2020  Birth time:11:18 PM  Gender:Female  Living status:Living  Apgars:8 ,9  Weight:2931 g   Magnesium Sulfate received: No BMZ received: No Rhophylac:No MMR:No T-DaP:Given prenatally Flu: No Transfusion:No  Physical exam  Vitals:   06/27/20 1616 06/27/20 2045 06/28/20 0545 06/28/20 0551  BP: 110/67 105/73 108/71   Pulse: 63 77 62   Resp: _0 Temp: 98.6 F (37 C) 98.2 F (36.8 C)  98.3 F (36.8 C)  TempSrc: Oral Oral  Oral  SpO2:  99% 99%    General: alert, cooperative and no distress Lochia: appropriate Uterine Fundus: firm Incision: Healing well with no significant drainage, No significant erythema DVT Evaluation: No evidence of DVT seen on physical  exam. Negative Homan's sign. No cords or calf tenderness. Labs: Lab Results  Component Value Date   WBC 13.0 (H) 06/28/2020   HGB 11.3 (L) 06/28/2020   HCT 33.9 (L) 06/28/2020   MCV 92.6 06/28/2020   PLT 203 06/28/2020   CMP Latest Ref Rng & Units 02/05/2013  Glucose 70 - 99 mg/dL 89  BUN 6 - 23 mg/dL 10  Creatinine 0.40 - 1.20 mg/dL 0.7  Sodium 135 - 145 mEq/L 137  Potassium 3.5 - 5.1 mEq/L 4.3  Chloride 96 - 112 mEq/L 103  CO2 19 - 32 mEq/L 29  Calcium 8.4 - 10.5 mg/dL 9.3  Total Protein 6.0 - 8.3 g/dL 6.8  Total Bilirubin 0.3 - 1.2 mg/dL 0.6  Alkaline Phos 39 - 117 U/L 38(L)  AST 0 - 37 U/L 24  ALT 0 - 35 U/L 32   Edinburgh Score: Edinburgh Postnatal Depression Scale Screening Tool 06/27/2020  I have been able to laugh and see the funny side of things. 0  I have looked forward with enjoyment to things. 0  I have blamed myself unnecessarily when things went wrong. 0  I have been anxious or worried for no good reason. 0  I have felt scared or panicky for no good reason. 0  Things have been getting on top of me. 0  I have been so unhappy that I have had difficulty sleeping. 0  I have felt sad or miserable. 0  I have been so unhappy that I have been crying. 0  The thought of harming myself has occurred to me. 0  Edinburgh Postnatal Depression Scale Total 0     After visit meds:  Allergies as of 06/28/2020      Reactions   Azithromycin Rash, Hives      Medication List    STOP taking these medications   PRENATAL+DHA PO     TAKE these medications   calcium carbonate 500 MG chewable tablet Commonly known as: TUMS - dosed in mg elemental calcium Chew 2 tablets by mouth at bedtime as needed for indigestion or heartburn.   ibuprofen 600 MG tablet Commonly known as: ADVIL Take 1 tablet (600 mg total) by mouth every 6 (six) hours.   multivitamin with minerals Tabs tablet Take 1 tablet by mouth daily.   nebivolol 5 MG tablet Commonly known as: Bystolic Take 0.5  tablets (2.5 mg total) by mouth in the morning and at bedtime.   sodium chloride 0.65 % Soln nasal spray Commonly known as: OCEAN Place 1 spray into both nostrils as needed for congestion.        Discharge home in stable condition Infant Feeding: Breast Infant Disposition:home with mother Discharge instruction: per After Visit Summary and Postpartum booklet. Activity: Advance as tolerated. Pelvic rest for 6 weeks.  Diet: routine diet Future Appointments:No future appointments. Follow up Visit: 6 weeks postpartum visit  06/28/2020 Linda Hedges, DO

## 2020-06-28 NOTE — Lactation Note (Signed)
This note was copied from a baby's chart. Lactation Consultation Note  Patient Name: Girl Jenille Laszlo VEHMC'N Date: 06/28/2020   Infant is 74 hours old 39 weeks 4% weight loss. Mom is exclusively breastfeeding. Last night infant was cluster feeding, Mom asked how to know when the baby was finished. Nipples were sore since baby latched on and off for an hour during the cluster.  LC noted the left nipple was inverted appeared red and slightly abraded and right nipple some erythema. Tried to awake infant to assess the depth of the latch, but she as sleeping soundly. Placed baby skin to skin with Mom.   Mom given comfort gels to help with healing.  Mom instructed to call me when the baby cues for next feeding to assess the latch. Education given on cues, pacifier use after 4 weeks, hand expression and spoon feeding. Mom has a pump at home.   Will review night time feedings, duration and frequency before discharge, signs and prevention of engorgement.  Information on outpatient lactation services provided.     Maternal Data    Feeding Feeding Type: Breast Fed  LATCH Score Latch: Repeated attempts needed to sustain latch, nipple held in mouth throughout feeding, stimulation needed to elicit sucking reflex.  Audible Swallowing: Spontaneous and intermittent  Type of Nipple: Everted at rest and after stimulation  Comfort (Breast/Nipple): Soft / non-tender  Hold (Positioning): No assistance needed to correctly position infant at breast.  LATCH Score: 9  Interventions Interventions: Skin to skin;Support pillows  Lactation Tools Discussed/Used     Consult Status      Myrtha Tonkovich  Nicholson-Springer 06/28/2020, 10:28 AM

## 2020-06-28 NOTE — Progress Notes (Signed)
Post Partum Day 2 Subjective: no complaints, up ad lib, voiding and tolerating PO  Objective: Blood pressure 108/71, pulse 62, temperature 98.3 F (36.8 C), temperature source Oral, resp. rate 20, SpO2 99 %, unknown if currently breastfeeding.  Physical Exam:  General: alert, cooperative and appears stated age Lochia: appropriate Uterine Fundus: firm Incision: healing well, no significant drainage, no dehiscence DVT Evaluation: No evidence of DVT seen on physical exam. Negative Homan's sign. No cords or calf tenderness.  Recent Labs    06/27/20 0340 06/28/20 0616  HGB 12.1 11.3*  HCT 36.1 33.9*    Assessment/Plan: Discharge home and Breastfeeding   LOS: 2 days   Linda Hedges 06/28/2020, 9:05 AM

## 2020-06-28 NOTE — Discharge Instructions (Signed)
Call MD for T>100.4, heavy vaginal bleeding, severe abdominal pain, or respiratory distress.  Call office to schedule postpartum visit in 6 weeks.  Pelvic rest x 6 weeks.   

## 2020-07-01 ENCOUNTER — Inpatient Hospital Stay (HOSPITAL_COMMUNITY): Admission: RE | Admit: 2020-07-01 | Payer: BC Managed Care – PPO | Source: Home / Self Care

## 2020-07-22 ENCOUNTER — Other Ambulatory Visit: Payer: Self-pay | Admitting: Obstetrics & Gynecology

## 2020-07-22 DIAGNOSIS — N63 Unspecified lump in unspecified breast: Secondary | ICD-10-CM

## 2020-07-31 ENCOUNTER — Ambulatory Visit: Payer: BC Managed Care – PPO | Attending: Family

## 2020-07-31 DIAGNOSIS — Z23 Encounter for immunization: Secondary | ICD-10-CM

## 2020-08-06 ENCOUNTER — Other Ambulatory Visit: Payer: BC Managed Care – PPO

## 2020-08-07 ENCOUNTER — Ambulatory Visit
Admission: RE | Admit: 2020-08-07 | Discharge: 2020-08-07 | Disposition: A | Discharge status: Home / Self Care | Payer: BC Managed Care – PPO | Source: Ambulatory Visit | Attending: Obstetrics & Gynecology | Admitting: Obstetrics & Gynecology

## 2020-08-07 ENCOUNTER — Other Ambulatory Visit: Payer: Self-pay

## 2020-08-07 DIAGNOSIS — N63 Unspecified lump in unspecified breast: Secondary | ICD-10-CM

## 2020-08-18 NOTE — Progress Notes (Signed)
   Covid-19 Vaccination Clinic  Name:  Kelly Fischer    MRN: [id number] DOB: 03-24-1981  08/18/2020  [name] was observed post Covid-19 immunization for 15 minutes without incident. She was provided with Vaccine Information Sheet and instruction to access the V-Safe system.   [name] was instructed to call 911 with any severe reactions post vaccine: Marland Kitchen Difficulty breathing  . Swelling of face and throat  . A fast heartbeat  . A bad rash all over body  . Dizziness and weakness   Immunizations Administered    Name Date Dose VIS Date Route   Pfizer COVID-19 Vaccine 07/31/2020  2:40 PM 0.3 mL 01/22/2019 Intramuscular   Manufacturer: [place]   Lot: [id number]   [place]: [id number]

## 2020-08-31 ENCOUNTER — Other Ambulatory Visit: Payer: BC Managed Care – PPO

## 2020-09-01 ENCOUNTER — Ambulatory Visit
Admission: RE | Admit: 2020-09-01 | Discharge: 2020-09-01 | Disposition: A | Discharge status: Home / Self Care | Payer: BC Managed Care – PPO | Source: Ambulatory Visit | Attending: Otolaryngology | Admitting: Otolaryngology

## 2020-09-01 ENCOUNTER — Other Ambulatory Visit: Payer: Self-pay

## 2020-09-01 DIAGNOSIS — G253 Myoclonus: Secondary | ICD-10-CM

## 2020-09-01 MED ORDER — GADOBENATE DIMEGLUMINE 529 MG/ML IV SOLN
15.0000 mL | Freq: Once | INTRAVENOUS | Status: AC | PRN
  Administered 2020-09-01: 15 mL via INTRAVENOUS

## 2020-10-27 ENCOUNTER — Ambulatory Visit: Payer: Self-pay | Admitting: General Surgery

## 2020-10-27 NOTE — H&P (Signed)
The patient is a 39 year old female who presents with hemorrhoids. 39 year old female with complaints of a external skin tag. She states that it has been present for approximately 10 years. It occasionally becomes inflamed and irritated. The most recent episode was in February. She denies any constipation. She does feel some incomplete evacuation which causes her to strain on a regular basis. She denies any rectal bleeding. She feels like the area is becoming larger and more difficult to clean. She denies any itching or burning. She recently gave birth and did have quite a bit of trouble during pregnancy with her hemorrhoids.   Problem List/Past Medical Leighton Ruff, MD; 12/87/8676 12:02 PM) EXTERNAL HEMORRHOIDS WITH OTHER COMPLICATION (H20.9)  Past Surgical History Leighton Ruff, MD; 47/07/6282 12:02 PM) Oral Surgery  Diagnostic Studies History Leighton Ruff, MD; 66/29/4765 12:02 PM) Colonoscopy never Mammogram within last year never Pap Smear 1-5 years ago  Allergies Darden Palmer, RMA; 10/27/2020 11:35 AM) Zithromax Z-Pak *MACROLIDES* Rash. Allergies Reconciled  Medication History Darden Palmer, [place]; 46/50/3546 56:81 AM) Bystolic (2.5MG  Tablet, Oral) Active. Salt (650MG  Tablet, Oral) Active. Calcium-Vitamin D (250MG  Capsule, Oral) Active. Magnesium (100MG  Capsule, Oral) Active. B Complex (Oral) Active. Medications Reconciled  Social History Leighton Ruff, MD; 27/51/7001 12:02 PM) Alcohol use Moderate alcohol use, Occasional alcohol use. Caffeine use Coffee, Tea. No drug use Tobacco use Never smoker.  Family History Leighton Ruff, MD; 74/94/4967 12:02 PM) Arthritis Mother. Colon Polyps Mother. Family history unknown First Degree Relatives Heart Disease Mother. Heart disease in female family member before age 83 Hypertension Brother.  Pregnancy / Birth History Leighton Ruff, MD; 59/16/3846 12:02 PM) Durenda Age 1 0 Maternal age  54-40 Para 1 0 Regular periods  Other Problems Leighton Ruff, MD; 65/99/3570 12:02 PM) Gastroesophageal Reflux Disease Hemorrhoids     Review of Systems Leighton Ruff MD; 17/79/3903 12:02 PM) General Present- Fatigue. Not Present- Appetite Loss, Chills, Fever, Night Sweats, Weight Gain and Weight Loss. Skin Not Present- Change in Wart/Mole, Dryness, Hives, Jaundice, New Lesions, Non-Healing Wounds, Rash and Ulcer. HEENT Present- Seasonal Allergies. Not Present- Earache, Hearing Loss, Hoarseness, Nose Bleed, Oral Ulcers, Ringing in the Ears, Sinus Pain, Sore Throat, Visual Disturbances, Wears glasses/contact lenses and Yellow Eyes. Respiratory Not Present- Bloody sputum, Chronic Cough, Difficulty Breathing, Snoring and Wheezing. Breast Present- Breast Pain. Not Present- Breast Mass, Nipple Discharge and Skin Changes. Cardiovascular Present- Palpitations and Rapid Heart Rate. Not Present- Chest Pain, Difficulty Breathing Lying Down, Leg Cramps, Shortness of Breath and Swelling of Extremities. Gastrointestinal Present- Hemorrhoids. Not Present- Abdominal Pain, Bloating, Bloody Stool, Change in Bowel Habits, Chronic diarrhea, Constipation, Difficulty Swallowing, Excessive gas, Gets full quickly at meals, Indigestion, Nausea, Rectal Pain and Vomiting. Female Genitourinary Not Present- Frequency, Nocturia, Painful Urination, Pelvic Pain and Urgency. Musculoskeletal Present- Back Pain. Not Present- Joint Pain, Joint Stiffness, Muscle Pain, Muscle Weakness and Swelling of Extremities. Neurological Not Present- Decreased Memory, Fainting, Headaches, Numbness, Seizures, Tingling, Tremor, Trouble walking and Weakness. Psychiatric Not Present- Anxiety, Bipolar, Change in Sleep Pattern, Depression, Fearful and Frequent crying. Endocrine Not Present- Cold Intolerance, Excessive Hunger, Hair Changes, Heat Intolerance, Hot flashes and New Diabetes. Hematology Not Present- Blood Thinners, Easy  Bruising, Excessive bleeding, Gland problems, HIV and Persistent Infections.  Vitals Lattie Haw Cambria RMA; 10/27/2020 11:36 AM) 10/27/2020 11:35 AM Weight: 136 lb Height: 65in Body Surface Area: 1.68 m Body Mass Index: 22.63 kg/m  Temp.: 97.25F  Pulse: 77 (Regular)  P.OX: 97% (Room air) BP: 120/74(Sitting, Left Arm, Standard)  Physical Exam Leighton Ruff MD; 29/12/1113 12:02 PM)  General Mental Status-Alert. General Appearance-Not in acute distress. Build & Nutrition-Well nourished. Posture-Normal posture. Gait-Normal.  Head and Neck Head-normocephalic, atraumatic with no lesions or palpable masses. Trachea-midline.  Chest and Lung Exam Chest and lung exam reveals -on auscultation, normal breath sounds, no adventitious sounds and normal vocal resonance.  Cardiovascular Cardiovascular examination reveals -normal heart sounds, regular rate and rhythm with no murmurs and no digital clubbing, cyanosis, edema, increased warmth or tenderness.  Abdomen Inspection Inspection of the abdomen reveals - No Hernias. Palpation/Percussion Palpation and Percussion of the abdomen reveal - Soft, Non Tender, No Rigidity (guarding), No hepatosplenomegaly and No Palpable abdominal masses.  Rectal Anorectal Exam External - skin tag(posterior with signs of chronic inflammation). Internal - normal internal exam.  Neurologic Neurologic evaluation reveals -alert and oriented x 3 with no impairment of recent or remote memory, normal attention span and ability to concentrate, normal sensation and normal coordination.  Musculoskeletal Normal Exam - Bilateral-Upper Extremity Strength Normal and Lower Extremity Strength Normal.    Assessment & Plan Leighton Ruff MD; 52/06/222 11:49 AM)  EXTERNAL HEMORRHOIDS WITH OTHER COMPLICATION (V61.2) Impression: chronically inflamed external skin tag L posterior. I have recommended excision. We discussed  postoperative pain and recovery, in detail. All questions were answered. I think that she'll most likely need about a week off of work. Risks include bleeding, pain, recurrence.

## 2021-01-17 IMAGING — MG DIGITAL DIAGNOSTIC BILAT W/ TOMO W/ CAD
6 of 10 series · 6 of 30 positions shown · non-contrast
Comparison: None.

CLINICAL DATA: 38-year-old 6 week postpartum female who is
currently breast feeding states she felt a lump in the left breast
that is decreasing in size.

EXAM:
DIGITAL DIAGNOSTIC BILATERAL MAMMOGRAM WITH CAD AND TOMO
ULTRASOUND LEFT BREAST

[L MLO synth-2D]
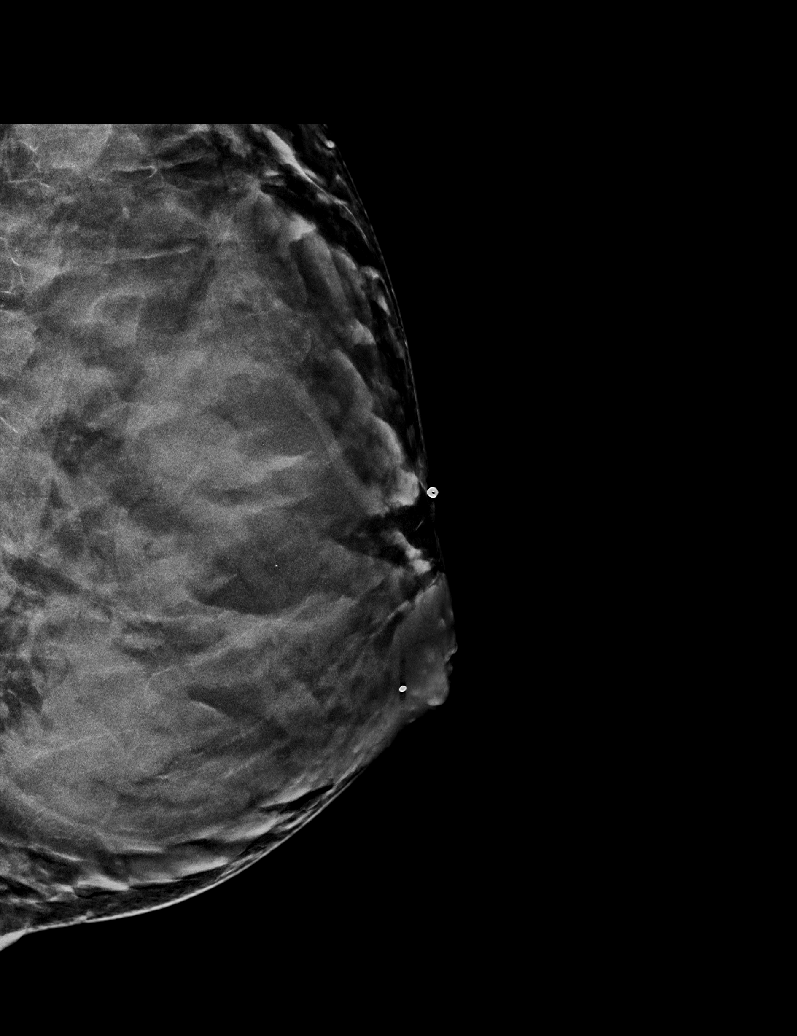

[R MLO synth-2D]
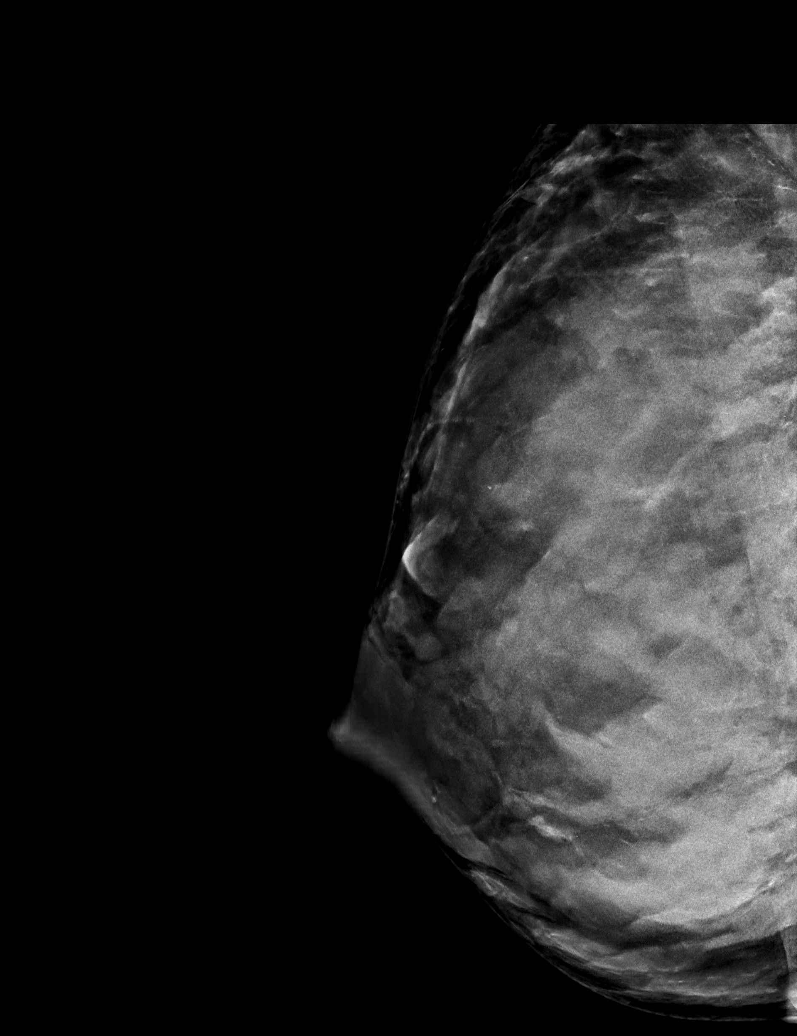

[L CC synth-2D]
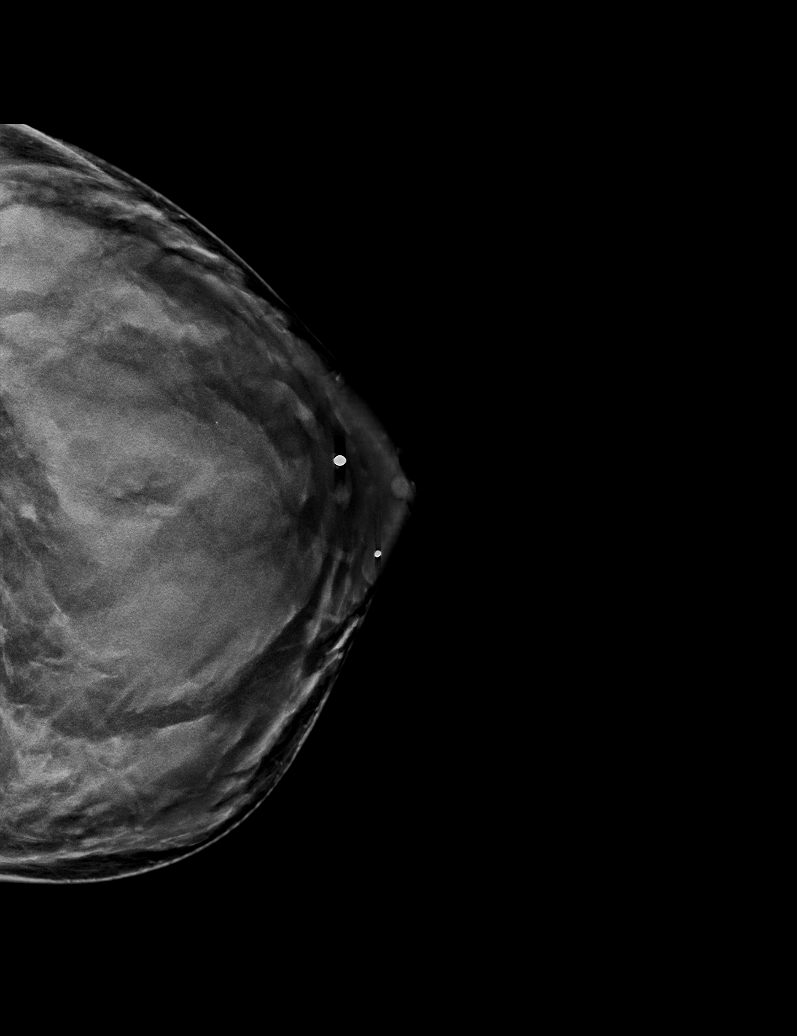

[R CC synth-2D]
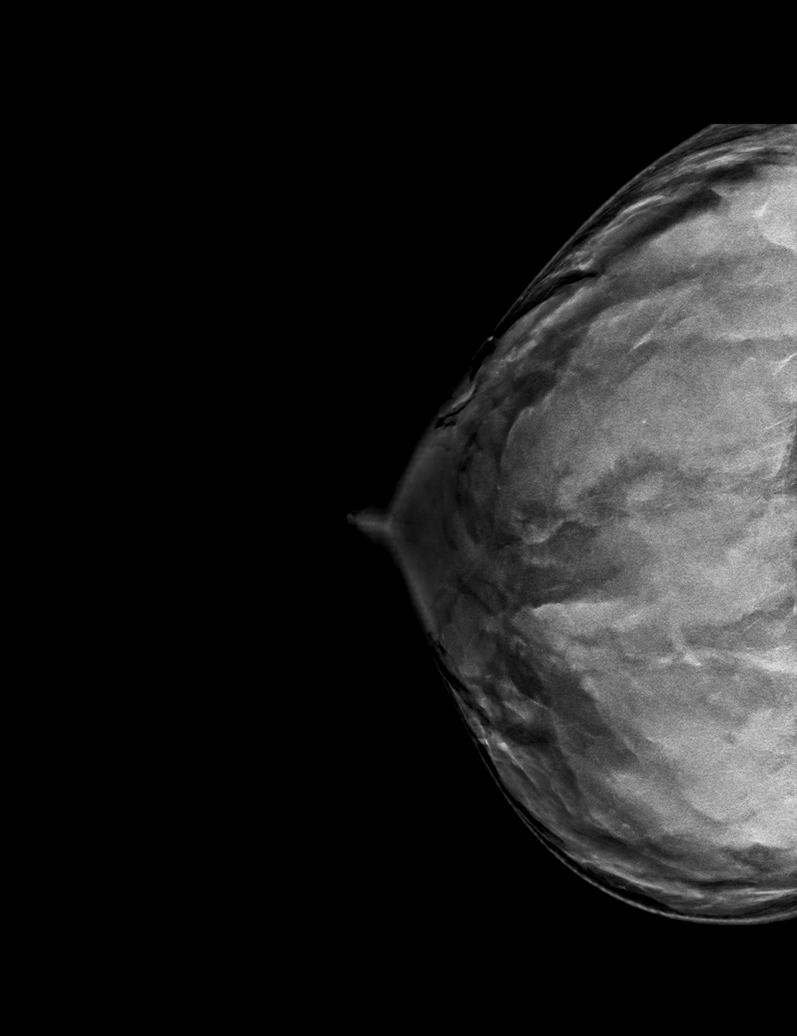

[L ML synth-2D]
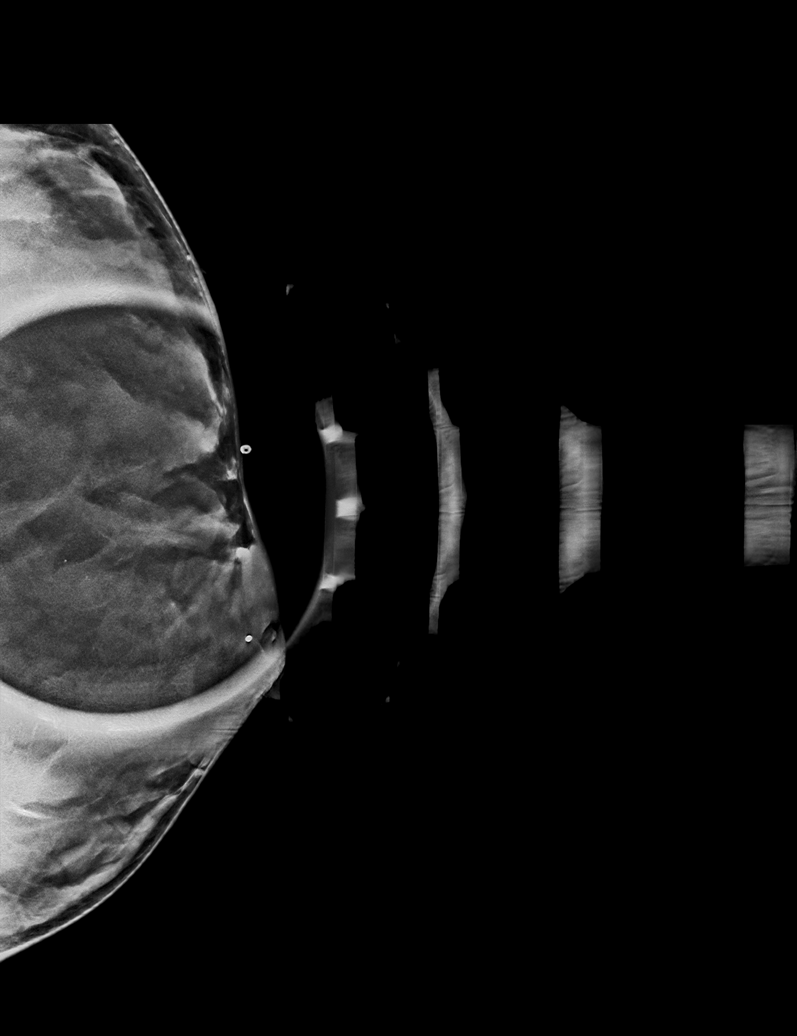

[R CC tomo · tomo slice 40/79.0]
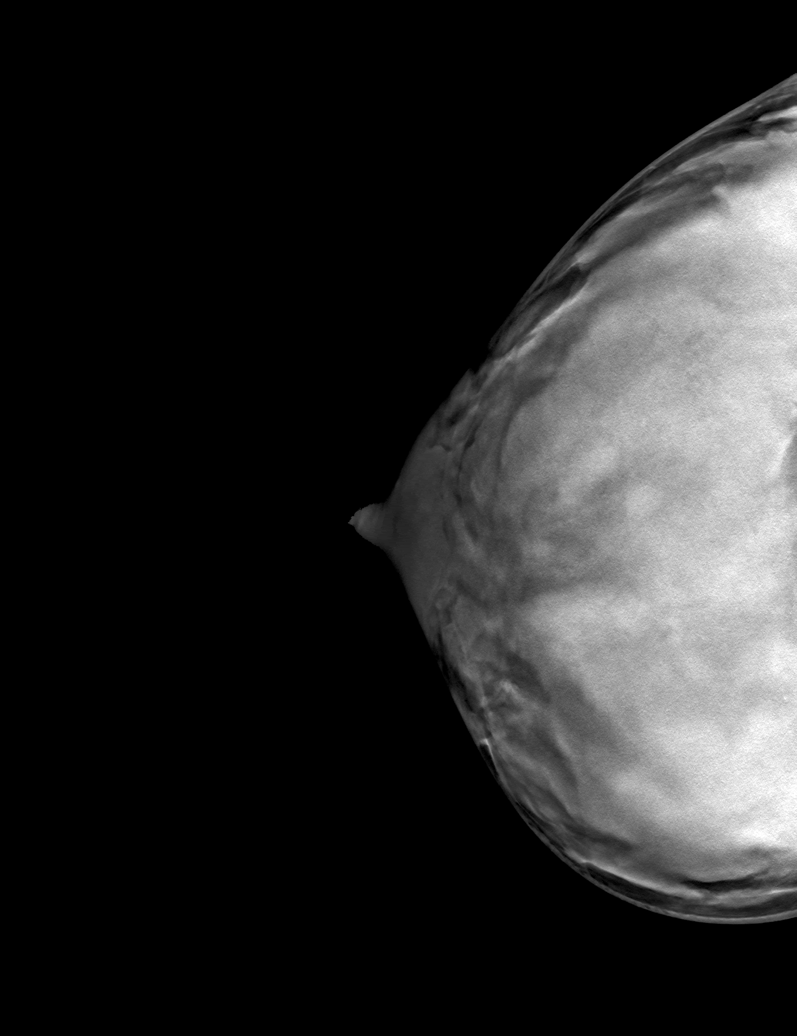

[6 of 30 positions shown; findings below may reference images not displayed]

ACR Breast Density Category d: The breast tissue is extremely dense,
which lowers the sensitivity of mammography.
FINDINGS: No suspicious mass, malignant type microcalcifications or distortion
detected in either breast.

Mammographic images were processed with CAD.

On physical exam, I do not palpate a mass in the area of clinical
concern in the left breast [DATE] 3 cm from the nipple.

Targeted ultrasound is performed, showing normal tissue in the area
of clinical concern at [DATE] 3 cm from the nipple. No suspicious
mass, distortion or shadowing detected.
IMPRESSION: No evidence of malignancy in either breast.

RECOMMENDATION:
If the clinical exam remains benign/stable screening mammography can
be deferred until the age of 40.

I have discussed the findings and recommendations with the patient.
If applicable, a reminder letter will be sent to the patient
regarding the next appointment.

BI-RADS CATEGORY  1: Negative.

## 2021-06-25 ENCOUNTER — Other Ambulatory Visit: Payer: Self-pay | Admitting: Internal Medicine

## 2021-09-07 ENCOUNTER — Telehealth: Payer: Self-pay | Admitting: Gastroenterology

## 2021-09-07 NOTE — Telephone Encounter (Signed)
Left message to call back.  Received message from [place] to call pt to schedule appt for rectal bleeding. Received referral for Dr. Hilarie Fredrickson from pt's PCP Dr. Doy Hutching from [place]. You can see referral in Epic for reference.

## 2021-09-07 NOTE — Telephone Encounter (Signed)
Consult sch on 09/28/21 at 2:30pm.

## 2021-09-28 ENCOUNTER — Ambulatory Visit: Payer: BC Managed Care – PPO | Admitting: Nurse Practitioner

## 2021-09-28 ENCOUNTER — Encounter: Payer: Self-pay | Admitting: Nurse Practitioner

## 2021-09-28 VITALS — BP 120/72 | HR 76 | Ht 65.0 in | Wt 125.0 lb

## 2021-09-28 DIAGNOSIS — K625 Hemorrhage of anus and rectum: Secondary | ICD-10-CM

## 2021-09-28 DIAGNOSIS — Z8371 Family history of colonic polyps: Secondary | ICD-10-CM

## 2021-09-28 DIAGNOSIS — K644 Residual hemorrhoidal skin tags: Secondary | ICD-10-CM

## 2021-09-28 DIAGNOSIS — K648 Other hemorrhoids: Secondary | ICD-10-CM | POA: Diagnosis not present

## 2021-09-28 NOTE — Patient Instructions (Addendum)
If you are age 38 or younger, your body mass index should be between 19-25. Your Body mass index is 20.8 kg/m. If this is out of the aformentioned range listed, please consider follow up with your Primary Care Provider.   The [facility] providers would like to encourage you to use [facility] to communicate with providers for non-urgent requests or questions.  Due to long hold times on the telephone, sending your provider a message by [facility] may be faster and more efficient way to get a response. Please allow 48 business hours for a response.  Please remember that this is for non-urgent requests/questions.  PROCEDURES: You have been scheduled for a colonoscopy. Please follow the written instructions given to you at your visit today. We have provided you with the prep. If you use inhalers (even only as needed), please bring them with you on the day of your procedure.  RECOMMENDATIONS: Please stop by the lab 3 days before your colonoscopy for a pregnancy test. Desitin: Apply a small amount to the external and internal anal area three times a day as needed.  It was great seeing you today! Thank you for entrusting me with your care and choosing [facility].  Noralyn Pick, CRNP

## 2021-09-28 NOTE — Progress Notes (Addendum)
09/28/2021 Kelly Fischer [id number] 02-15-1981   CHIEF COMPLAINT: Rectal bleeding  HISTORY OF PRESENT ILLNESS:  Kelly Fischer is a 40 year old female with a past medical history of dysautonomia, Raynaud's disease, tachycardia and GERD.  She presents to our office today as referred by Dr. Fulton Reek for further evaluation regarding rectal bleeding.  She has a prior history of external and internal hemorrhoids with infrequent rectal bleeding.  She saw Dr. Earlean Shawl in the past and she was told her hemorrhoids could not be banded.  She was seen by [place] general surgeon less than 1 year ago and a hemorrhoidectomy was not recommended.  Approximately 1 month ago, she passed a moderate amount of bright red blood with a BM which was rather alarming.  No associated anorectal or abdominal pain at that time.  Since then, she describes seeing bright red blood on the toilet tissue every other day.  Since the birth of her daughter 15 months ago, her bowel pattern has been irregular.  She often awakens in the middle the night to pass a formed bowel movement.  No specific food triggers but if she drinks wine she usually awakens at night to have a BM.  Her stool consistency varies during the day, she may pass a solid and loose stool on the same day.  She noticed her stool diameter narrow at times.  She has frequent abdominal bloat.  She eats a high-fiber diet.  Her mother has a history of colon polyps which were identified at the time of her initial colonoscopy at the age of 61.  No known family history of colorectal cancer, IBD or celiac disease.  She takes Ibuprofen 600 mg 1 or 2 doses for 1 day during her menstrual cycle.  No weight loss.  She was initially seen by Dr. Ardis Hughs in 2014 due to having nausea, loose stools and abdominal imaging showed a dilated CBD duct.  Celiac serology was negative.  She underwent an EUS 03/21/2013 which was unrevealing.   She has a history of dysautonomia and PVCs  which is well controlled on by systolic.  No history of coronary artery disease.  Echo showed LVEF 55 to 60%.  Laboratory studies 09/06/2021: WBC 5.9.  Hemoglobin 12.8.  Hematocrit 38.5.  Platelet 262.  Glucose 83.  Sodium 140.  Potassium 4.5.  BUN 13.  Creatinine 0.8.  Alk phos 45.  Total bili 0.4.  AST 14.  ALT 8.  Laboratory studies 02/19/2013: IgA 217.  Endomysial screen negative.  TTG IgA 4.5.  Gliadin IgG 10.2.  Gliadin IgA 13.0.    EGD/EUS by Dr. Ardis Hughs 03/21/2013: EGD findings: 1.  There was mild nonspecific gastritis, biopsied and sent to pathology 2.  UGI tract was otherwise normal Path report: No evidence of H. pylori. EUS findings: 1. Pancreas was normal 2. CBD, extrahepatic biliary tree was normal, nondilated and no stones 3. Gallbladder was normal 4. No peripancreatic or portal adenopathy 5. Limited view of the liver, spleen, portal and splenic vessels were all normal Impression: Mild gastritis was noted, biopsied.  The examination, both luminal and EUS was otherwise normal.  Recent acute watery diarrheal illness likely infectious, self-limited  CT scan 01/2013 IMPRESSION:  1. Mild diffuse biliary ductal dilatation (cbd 71m). Normal appearing gallbladder, and no etiology apparent by CT. Recommend correlationwith liver function tests; if abnormal, MRCP should be considered for further evaluation. 2. No other significant abnormality identified  Past Medical History:  Diagnosis Date   Abnormal EKG  Prominent QRS voltage, short PR interval, ST-T wave abnormalities.    Dysautonomia ([place]) since jan 2014   bp increases   GERD (gastroesophageal reflux disease)    PVC (premature ventricular contraction)    Raynaud disease 1995   Raynaud's disease    Tachycardia    Past Surgical History:  Procedure Laterality Date   EUS N/A 03/21/2013   Procedure: UPPER ENDOSCOPIC ULTRASOUND (EUS) LINEAR;  Surgeon: Milus Banister, MD;  Location: [facility];  Service: Endoscopy;  Laterality:  N/A;   WISDOM TOOTH EXTRACTION  2002   Social History: She is a Designer, jewellery  at [employer].  She is married.  Non-smoker.  She drinks 2 or 3 glasses of wine on the weekends. No drug use.   Family History: Mother with history of colon polyps age 18 and tachycardia. Maternal grandmother with Lupus. No known family history of esophageal, gastric or colon cancer.   Allergies  Allergen Reactions   Azithromycin Rash and Hives      Outpatient Encounter Medications as of 09/28/2021  Medication Sig   calcium carbonate (TUMS - DOSED IN MG ELEMENTAL CALCIUM) 500 MG chewable tablet Chew 2 tablets by mouth at bedtime as needed for indigestion or heartburn.   ibuprofen (ADVIL) 600 MG tablet Take 1 tablet (600 mg total) by mouth every 6 (six) hours.   Multiple Vitamin (MULTIVITAMIN WITH MINERALS) TABS tablet Take 1 tablet by mouth daily.   nebivolol (BYSTOLIC) 2.5 MG tablet Take 1 tablet (2.5 mg total) by mouth daily. PLEASE CONTACT OFFICE FOR AN APPOINTMENT FOR ADDITIONAL REFILLS 1ST ATTEMPT   nebivolol (BYSTOLIC) 5 MG tablet Take 0.5 tablets (2.5 mg total) by mouth in the morning and at bedtime.   sodium chloride (OCEAN) 0.65 % SOLN nasal spray Place 1 spray into both nostrils as needed for congestion.   No facility-administered encounter medications on file as of 09/28/2021.   REVIEW OF SYSTEMS: Gen: Denies fever, sweats or chills. No weight loss.  CV: See HPI. Resp: + cough. No shortness of breath of hemoptysis.  GI: See HPI  GU : Denies urinary burning, blood in urine, increased urinary frequency or incontinence. MS: + muscle cramps. Derm: Denies rash, itchiness, skin lesions or unhealing ulcers. Psych: Denies depression, anxiety or memory loss. Heme: Denies bruising, bleeding. Neuro:  + sleeping issues. Denies headaches, dizziness or paresthesias. Endo:  Denies any problems with DM, thyroid or adrenal function.  PHYSICAL EXAM: There were no vitals taken for this visit. BP  120/72   Pulse 76   Ht 5' 5" (1.651 m)   Wt 125 lb (56.7 kg)   LMP 08/31/2021 (Approximate)   Breastfeeding No   BMI 20.80 kg/m  She does not utilize birth control. General: Well developed 40 year old female in no acute distress. Head: Normocephalic and atraumatic. Eyes:  Sclerae non-icteric, conjunctive pink. Ears: Normal auditory acuity. Mouth: Dentition intact. No ulcers or lesions.  Neck: Supple, no lymphadenopathy or thyromegaly.  Lungs: Clear bilaterally to auscultation without wheezes, crackles or rhonchi. Heart: Regular rate and rhythm. No murmur, rub or gallop appreciated.  Abdomen: Soft, nontender, non distended. No masses. No hepatosplenomegaly. Normoactive bowel sounds x 4 quadrants.  Rectal: A cluster of noninflamed external hemorrhoids to the posterior anal area.  Anal hemorrhoids without prolapse.  No blood or stool in the rectal vault.  No fissure.  Melissa CMA present during exam. Musculoskeletal: Symmetrical with no gross deformities. Skin: Warm and dry. No rash or lesions on visible extremities. Extremities:  No edema. Neurological: Alert oriented x 4, no focal deficits.  Psychological:  Alert and cooperative. Normal mood and affect.  ASSESSMENT AND PLAN:  81) 39 year old female with history of internal and external hemorrhoids with intermittent rectal bleeding. One episode of a moderate amount of bright red blood per the rectum 1 month ago. -Apply a small amount of Desitin inside the anal opening and to the external anal area tid as needed for anal or hemorrhoidal irritation/bleeding.  Use unscented diaper wipes to clean the anal area as needed. -Diagnostic colonoscopy benefits and risks discussed including risk with sedation, risk of bleeding, perforation and infection  -Patient to present to our lab 3 to 4 days prior to her colonoscopy to have a beta hCG quant level, results to be reviewed prior to proceeding with a diagnostic colonoscopy -Patient to call our office  if her symptoms worsen  2) Change in bowel pattern, awakening during the middle the night to pass a solid stool, 1 to 2 solid or loose stools during the day, narrow stool diameter  -See plan in #1  3) Family history of colon polyps -See plan in # 1  4) Dysautonomia with PVCs well-controlled on Bystolic  Addendum: Beta hCG 11/30/2021 was 0.6. Patient to proceed with colonoscopy as scheduled.       CC:  Idelle Crouch, MD

## 2021-09-29 NOTE — Progress Notes (Signed)
I agree with the above note, plan 

## 2021-11-30 ENCOUNTER — Encounter: Payer: Self-pay | Admitting: Gastroenterology

## 2021-11-30 ENCOUNTER — Other Ambulatory Visit (INDEPENDENT_AMBULATORY_CARE_PROVIDER_SITE_OTHER): Payer: BC Managed Care – PPO

## 2021-11-30 DIAGNOSIS — Z8371 Family history of colonic polyps: Secondary | ICD-10-CM

## 2021-11-30 DIAGNOSIS — K625 Hemorrhage of anus and rectum: Secondary | ICD-10-CM | POA: Diagnosis not present

## 2021-11-30 DIAGNOSIS — K648 Other hemorrhoids: Secondary | ICD-10-CM | POA: Diagnosis not present

## 2021-11-30 DIAGNOSIS — K644 Residual hemorrhoidal skin tags: Secondary | ICD-10-CM

## 2021-11-30 LAB — HCG, QUANTITATIVE, PREGNANCY: Quantitative HCG: <0.6 m[IU]/mL

## 2021-12-03 ENCOUNTER — Other Ambulatory Visit (INDEPENDENT_AMBULATORY_CARE_PROVIDER_SITE_OTHER): Payer: BC Managed Care – PPO

## 2021-12-03 ENCOUNTER — Encounter: Payer: Self-pay | Admitting: Gastroenterology

## 2021-12-03 ENCOUNTER — Ambulatory Visit (AMBULATORY_SURGERY_CENTER): Payer: BC Managed Care – PPO | Admitting: Gastroenterology

## 2021-12-03 ENCOUNTER — Other Ambulatory Visit: Payer: Self-pay | Admitting: *Deleted

## 2021-12-03 ENCOUNTER — Other Ambulatory Visit: Payer: Self-pay

## 2021-12-03 VITALS — BP 102/62 | HR 65 | Temp 97.7°F | Resp 15 | Ht 65.0 in | Wt 125.0 lb

## 2021-12-03 DIAGNOSIS — K625 Hemorrhage of anus and rectum: Secondary | ICD-10-CM

## 2021-12-03 DIAGNOSIS — K648 Other hemorrhoids: Secondary | ICD-10-CM | POA: Diagnosis not present

## 2021-12-03 DIAGNOSIS — Z8371 Family history of colonic polyps: Secondary | ICD-10-CM

## 2021-12-03 LAB — CBC WITH DIFFERENTIAL/PLATELET
Basophils Absolute: 0 10*3/uL (ref 0.0–0.1)
Basophils Relative: 0.6 % (ref 0.0–3.0)
Eosinophils Absolute: 0.1 10*3/uL (ref 0.0–0.7)
Eosinophils Relative: 1.3 % (ref 0.0–5.0)
HCT: 36.2 % (ref 36.0–46.0)
Hemoglobin: 11.9 g/dL — ABNORMAL LOW (ref 12.0–15.0)
Lymphocytes Relative: 22.5 % (ref 12.0–46.0)
Lymphs Abs: 1.6 10*3/uL (ref 0.7–4.0)
MCHC: 33 g/dL (ref 30.0–36.0)
MCV: 91 fl (ref 78.0–100.0)
Monocytes Absolute: 0.4 10*3/uL (ref 0.1–1.0)
Monocytes Relative: 6 % (ref 3.0–12.0)
Neutro Abs: 5.1 10*3/uL (ref 1.4–7.7)
Neutrophils Relative %: 69.6 % (ref 43.0–77.0)
Platelets: 250 10*3/uL (ref 150.0–400.0)
RBC: 3.98 Mil/uL (ref 3.87–5.11)
RDW: 13.4 % (ref 11.5–15.5)
WBC: 7.3 10*3/uL (ref 4.0–10.5)

## 2021-12-03 MED ORDER — SODIUM CHLORIDE 0.9 % IV SOLN: 500.0000 mL | INTRAVENOUS | Status: DC

## 2021-12-03 NOTE — Progress Notes (Signed)
HPI: This is a woman with minor rectla bleeding   ROS: complete GI ROS as described in HPI, all other review negative.  Constitutional:  No unintentional weight loss   Past Medical History:  Diagnosis Date   Abnormal EKG    Prominent QRS voltage, short PR interval, ST-T wave abnormalities.    Dysautonomia ([place]) since jan 2014   bp increases   GERD (gastroesophageal reflux disease)    PVC (premature ventricular contraction)    Raynaud disease 1995   Raynaud's disease    Tachycardia     Past Surgical History:  Procedure Laterality Date   EUS N/A 03/21/2013   Procedure: UPPER ENDOSCOPIC ULTRASOUND (EUS) LINEAR;  Surgeon: Milus Banister, MD;  Location: [facility];  Service: Endoscopy;  Laterality: N/A;   WISDOM TOOTH EXTRACTION  2002    Current Outpatient Medications  Medication Sig Dispense Refill   CALCIUM-MAGNESIUM-VITAMIN D PO Take 1 tablet by mouth daily.     ibuprofen (ADVIL) 600 MG tablet Take 1 tablet (600 mg total) by mouth every 6 (six) hours. 30 tablet 0   Multiple Vitamin (MULTIVITAMIN WITH MINERALS) TABS tablet Take 1 tablet by mouth daily.     nebivolol (BYSTOLIC) 5 MG tablet Take 0.5 tablets (2.5 mg total) by mouth in the morning and at bedtime. 90 tablet 1   sodium chloride (OCEAN) 0.65 % SOLN nasal spray Place 1 spray into both nostrils as needed for congestion.     No current facility-administered medications for this visit.    Allergies as of 12/03/2021 - Review Complete 09/28/2021  Allergen Reaction Noted   Azithromycin Rash and Hives 08/05/2012    Family History  Problem Relation Age of Onset   Heart disease Mother    Colon polyps Mother    Lupus Maternal Grandmother     Social History   Socioeconomic History   Marital status: Married    Spouse name: Not on file   Number of children: Not on file   Years of education: Not on file   Highest education level: Not on file  Occupational History   Occupation: Therapist, sports    Employer: [employer]   Tobacco Use   Smoking status: Never   Smokeless tobacco: Never  Substance and Sexual Activity   Alcohol use: Yes    Comment: occasional   Drug use: No   Sexual activity: Not on file  Other Topics Concern   Not on file  Social History Narrative   There is no history of premature coronary artery disease in her parents or siblings. No first-degree relatives have pacemakers or other devices.   Social Determinants of Health   Financial Resource Strain: Not on file  Food Insecurity: Not on file  Transportation Needs: Not on file  Physical Activity: Not on file  Stress: Not on file  Social Connections: Not on file  Intimate Partner Violence: Not on file     Physical Exam: BP 116/76    Pulse 74    Temp 97.7 F (36.5 C)    Ht 5\' 5"  (1.651 m)    Wt 125 lb (56.7 kg)    LMP 11/23/2021 Comment: HCG test done 3 days ago was negative   SpO2 100%    BMI 20.80 kg/m  Constitutional: generally well-appearing Psychiatric: alert and oriented x3 Lungs: CTA bilaterally Heart: no MCR  Assessment and plan: 41 y.o. female with minor rectal bleeding  Colonoscopy today  Care is appropriate for the ambulatory setting.  Owens Loffler, MD [facility]  12/03/2021, 12:58 PM

## 2021-12-03 NOTE — Patient Instructions (Signed)
YOU HAD AN ENDOSCOPIC PROCEDURE TODAY AT THE [facility]:   Refer to the procedure report that was given to you for any specific questions about what was found during the examination.  If the procedure report does not answer your questions, please call your gastroenterologist to clarify.  If you requested that your care partner not be given the details of your procedure findings, then the procedure report has been included in a sealed envelope for you to review at your convenience later.  YOU SHOULD EXPECT: Some feelings of bloating in the abdomen. Passage of more gas than usual.  Walking can help get rid of the air that was put into your GI tract during the procedure and reduce the bloating. If you had a lower endoscopy (such as a colonoscopy or flexible sigmoidoscopy) you may notice spotting of blood in your stool or on the toilet paper. If you underwent a bowel prep for your procedure, you may not have a normal bowel movement for a few days.  Please Note:  You might notice some irritation and congestion in your nose or some drainage.  This is from the oxygen used during your procedure.  There is no need for concern and it should clear up in a day or so.  SYMPTOMS TO REPORT IMMEDIATELY:   Following lower endoscopy (colonoscopy or flexible sigmoidoscopy):  Excessive amounts of blood in the stool  Significant tenderness or worsening of abdominal pains  Swelling of the abdomen that is new, acute  Fever of 100F or higher  For urgent or emergent issues, a gastroenterologist can be reached at any hour by calling [phone]. Do not use MyChart messaging for urgent concerns.    DIET:  We do recommend a small meal at first, but then you may proceed to your regular diet.  Drink plenty of fluids but you should avoid alcoholic beverages for 24 hours.  ACTIVITY:  You should plan to take it easy for the rest of today and you should NOT DRIVE or use heavy machinery until tomorrow (because  of the sedation medicines used during the test).    FOLLOW UP: Our staff will call the number listed on your records 48-72 hours following your procedure to check on you and address any questions or concerns that you may have regarding the information given to you following your procedure. If we do not reach you, we will leave a message.  We will attempt to reach you two times.  During this call, we will ask if you have developed any symptoms of COVID 19. If you develop any symptoms (ie: fever, flu-like symptoms, shortness of breath, cough etc.) before then, please call [phone].  If you test positive for Covid 19 in the 2 weeks post procedure, please call and report this information to us.    If any biopsies were taken you will be contacted by phone or by letter within the next 1-3 weeks.  Please call us at [phone] if you have not heard about the biopsies in 3 weeks.    SIGNATURES/CONFIDENTIALITY: You and/or your care partner have signed paperwork which will be entered into your electronic medical record.  These signatures attest to the fact that that the information above on your After Visit Summary has been reviewed and is understood.  Full responsibility of the confidentiality of this discharge information lies with you and/or your care-partner. 

## 2021-12-03 NOTE — Progress Notes (Signed)
To PACU, VSS. Report to [email or web address] 

## 2021-12-03 NOTE — Op Note (Signed)
[place] Patient Name: Kelly Fischer Procedure Date: 12/03/2021 12:47 PM MRN: [id number] Endoscopist: Milus Banister , MD Age: 41 Referring MD:  Date of Birth: 05-22-1981 Gender: Female Account #: [id number] Procedure:                Colonoscopy Indications:              Rectal bleeding Medicines:                Monitored Anesthesia Care Procedure:                Pre-Anesthesia Assessment:                           - Prior to the procedure, a History and Physical                            was performed, and patient medications and                            allergies were reviewed. The patient's tolerance of                            previous anesthesia was also reviewed. The risks                            and benefits of the procedure and the sedation                            options and risks were discussed with the patient.                            All questions were answered, and informed consent                            was obtained. Prior Anticoagulants: The patient has                            taken no previous anticoagulant or antiplatelet                            agents. ASA Grade Assessment: II - A patient with                            mild systemic disease. After reviewing the risks                            and benefits, the patient was deemed in                            satisfactory condition to undergo the procedure.                           After obtaining informed consent, the colonoscope  was passed under direct vision. Throughout the                            procedure, the patient's blood pressure, pulse, and                            oxygen saturations were monitored continuously. The                            CF [id number] #[id number] was introduced through the anus                            and advanced to the the cecum, identified by                            appendiceal orifice and ileocecal valve.  The                            colonoscopy was performed without difficulty. The                            patient tolerated the procedure well. The quality                            of the bowel preparation was good. The ileocecal                            valve, appendiceal orifice, and rectum were                            photographed. Scope In: 1:33:23 PM Scope Out: 1:43:52 PM Scope Withdrawal Time: 0 hours 5 minutes 12 seconds  Total Procedure Duration: 0 hours 10 minutes 29 seconds  Findings:                 External hemorrhoids were found. The hemorrhoids                            were medium-sized.                           The exam was otherwise without abnormality on                            direct and retroflexion views. Complications:            No immediate complications. Estimated blood loss:                            None. Estimated Blood Loss:     Estimated blood loss: none. Impression:               - External hemorrhoids.                           - The examination was otherwise normal on direct  and retroflexion views.                           - No polyps or cancers. Recommendation:           - Patient has a contact number available for                            emergencies. The signs and symptoms of potential                            delayed complications were discussed with the                            patient. Return to normal activities tomorrow.                            Written discharge instructions were provided to the                            patient.                           - Resume previous diet.                           - Continue present medications.                           - Repeat colonoscopy in 10 years for screening. Milus Banister, MD 12/03/2021 1:45:31 PM This report has been signed electronically.

## 2021-12-07 ENCOUNTER — Telehealth: Payer: Self-pay | Admitting: *Deleted

## 2021-12-07 ENCOUNTER — Telehealth: Payer: Self-pay

## 2021-12-07 NOTE — Telephone Encounter (Signed)
No answer on first follow up call. Left message  °

## 2021-12-07 NOTE — Telephone Encounter (Signed)
°  Follow up Call-  Call back number 12/03/2021  Post procedure Call Back phone  # [phone]  Permission to leave phone message Yes  Some recent data might be hidden     Patient questions:  Do you have a fever, pain , or abdominal swelling? No. Pain Score  0 *  Have you tolerated food without any problems? Yes.    Have you been able to return to your normal activities? Yes.    Do you have any questions about your discharge instructions: Diet   No. Medications  No. Follow up visit  No.  Do you have questions or concerns about your Care? No.  Actions: * If pain score is 4 or above: No action needed, pain <4.  Have you developed a fever since your procedure? no  2.   Have you had an respiratory symptoms (SOB or cough) since your procedure? no  3.   Have you tested positive for COVID 19 since your procedure no  4.   Have you had any family members/close contacts diagnosed with the COVID 19 since your procedure?  no   If yes to any of these questions please route to Joylene John, RN and Joella Prince, RN

## 2022-06-25 ENCOUNTER — Ambulatory Visit: Payer: BC Managed Care – PPO

## 2022-07-02 ENCOUNTER — Ambulatory Visit
Admission: RE | Admit: 2022-07-02 | Discharge: 2022-07-02 | Disposition: A | Discharge status: Home / Self Care | Payer: BC Managed Care – PPO | Source: Ambulatory Visit | Attending: Urgent Care | Admitting: Urgent Care

## 2022-07-02 VITALS — BP 133/80 | HR 91 | Temp 98.8°F | Resp 18

## 2022-07-02 DIAGNOSIS — R0982 Postnasal drip: Secondary | ICD-10-CM

## 2022-07-02 DIAGNOSIS — R053 Chronic cough: Secondary | ICD-10-CM | POA: Diagnosis not present

## 2022-07-02 DIAGNOSIS — R0981 Nasal congestion: Secondary | ICD-10-CM

## 2022-07-02 DIAGNOSIS — J189 Pneumonia, unspecified organism: Secondary | ICD-10-CM

## 2022-07-02 DIAGNOSIS — Z3A01 Less than 8 weeks gestation of pregnancy: Secondary | ICD-10-CM

## 2022-07-02 MED ORDER — AMOXICILLIN-POT CLAVULANATE 875-125 MG PO TABS: 1.0000 | ORAL_TABLET | Freq: Two times a day (BID) | ORAL | 0 refills | Status: DC

## 2022-07-02 NOTE — Discharge Instructions (Addendum)
We will manage this for left lower lobe pneumonia with Augmentin for 10 days. For sore throat or cough try using a honey-based tea. Use 3 teaspoons of honey with juice squeezed from half lemon. Place shaved pieces of ginger into 1/2-1 cup of water and warm over stove top. Then mix the ingredients and repeat every 4 hours as needed. Please take Tylenol '500mg'$ -'650mg'$  once every 6 hours for fevers, aches and pains. Hydrate very well with at least 2 liters (64 ounces) of water. Eat light meals such as soups (chicken and noodles, chicken wild rice, vegetable).  Do not eat any foods that you are allergic to.  Start an antihistamine like Zyrtec for postnasal drainage, sinus congestion.  You can take this together with Flonase.

## 2022-07-02 NOTE — ED Provider Notes (Signed)
[place]   MRN: [id number] DOB: 11-24-1981  Subjective:   Kelly Fischer is a 41 y.o. female presenting for 2-week history of persistent productive cough, malaise and fatigue, sinus congestion, drainage.  Patient has used multiple over-the-counter medications safe in her pregnancy.  She is at 7 weeks.  She has had multiple sick contacts but they have all improved and she continues to worsen.  No current facility-administered medications for this encounter.  Current Outpatient Medications:    CALCIUM-MAGNESIUM-VITAMIN D PO, Take 1 tablet by mouth daily., Disp: , Rfl:    ibuprofen (ADVIL) 600 MG tablet, Take 1 tablet (600 mg total) by mouth every 6 (six) hours., Disp: 30 tablet, Rfl: 0   Multiple Vitamin (MULTIVITAMIN WITH MINERALS) TABS tablet, Take 1 tablet by mouth daily., Disp: , Rfl:    nebivolol (BYSTOLIC) 5 MG tablet, Take 0.5 tablets (2.5 mg total) by mouth in the morning and at bedtime., Disp: 90 tablet, Rfl: 1   sodium chloride (OCEAN) 0.65 % SOLN nasal spray, Place 1 spray into both nostrils as needed for congestion., Disp: , Rfl:    Allergies  Allergen Reactions   Azithromycin Rash and Hives    Past Medical History:  Diagnosis Date   Abnormal EKG    Prominent QRS voltage, short PR interval, ST-T wave abnormalities.    Dysautonomia ([place]) since jan 2014   bp increases   GERD (gastroesophageal reflux disease)    PVC (premature ventricular contraction)    Raynaud disease 1995   Raynaud's disease    Tachycardia      Past Surgical History:  Procedure Laterality Date   EUS N/A 03/21/2013   Procedure: UPPER ENDOSCOPIC ULTRASOUND (EUS) LINEAR;  Surgeon: Milus Banister, MD;  Location: [facility];  Service: Endoscopy;  Laterality: N/A;   WISDOM TOOTH EXTRACTION  2002    Family History  Problem Relation Age of Onset   Heart disease Mother    Colon polyps Mother    Lupus Maternal Grandmother     Social History   Tobacco Use    Smoking status: Never   Smokeless tobacco: Never  Substance Use Topics   Alcohol use: Yes    Comment: occasional   Drug use: No    ROS   Objective:   Vitals: BP 133/80 (BP Location: Left Arm)   Pulse 91   Temp 98.8 F (37.1 C) (Oral)   Resp 18   SpO2 96%   Physical Exam Constitutional:      General: She is not in acute distress.    Appearance: Normal appearance. She is well-developed and normal weight. She is not ill-appearing, toxic-appearing or diaphoretic.  HENT:     Head: Normocephalic and atraumatic.     Right Ear: Tympanic membrane, ear canal and external ear normal. No drainage or tenderness. No middle ear effusion. There is no impacted cerumen. Tympanic membrane is not erythematous.     Left Ear: Tympanic membrane, ear canal and external ear normal. No drainage or tenderness.  No middle ear effusion. There is no impacted cerumen. Tympanic membrane is not erythematous.     Nose: Congestion present. No rhinorrhea.     Mouth/Throat:     Mouth: Mucous membranes are moist. No oral lesions.     Pharynx: No pharyngeal swelling, oropharyngeal exudate, posterior oropharyngeal erythema or uvula swelling.     Tonsils: No tonsillar exudate or tonsillar abscesses.     Comments: Significant postnasal drainage overlying pharynx. Eyes:  General: No scleral icterus.       Right eye: No discharge.        Left eye: No discharge.     Extraocular Movements: Extraocular movements intact.     Right eye: Normal extraocular motion.     Left eye: Normal extraocular motion.     Conjunctiva/sclera: Conjunctivae normal.  Cardiovascular:     Rate and Rhythm: Normal rate and regular rhythm.     Heart sounds: Normal heart sounds. No murmur heard.    No friction rub. No gallop.  Pulmonary:     Effort: Pulmonary effort is normal. No respiratory distress.     Breath sounds: No stridor. Examination of the left-lower field reveals rales. Rales present. No wheezing or rhonchi.  Chest:      Chest wall: No tenderness.  Musculoskeletal:     Cervical back: Normal range of motion and neck supple.  Lymphadenopathy:     Cervical: No cervical adenopathy.  Skin:    General: Skin is warm and dry.  Neurological:     General: No focal deficit present.     Mental Status: She is alert and oriented to person, place, and time.  Psychiatric:        Mood and Affect: Mood normal.        Behavior: Behavior normal.     Assessment and Plan :   PDMP not reviewed this encounter.  1. Pneumonia of left lower lobe due to infectious organism   2. Persistent cough   3. Sinus congestion   4. Post-nasal drainage   5. [redacted] weeks gestation of pregnancy    Due to her pregnancy I recommended avoiding the use of chest x-ray for now.  It is very reasonable to treat empirically based off of her exam with Augmentin which is safe in pregnancy.  Recommended supportive care otherwise. Counseled patient on potential for adverse effects with medications prescribed/recommended today, ER and return-to-clinic precautions discussed, patient verbalized understanding.    Jaynee Eagles, [place] 07/02/22 1538

## 2022-07-02 NOTE — ED Triage Notes (Signed)
Pt here with a persistent semi productive cough x 2 weeks.

## 2022-07-14 LAB — OB RESULTS CONSOLE RUBELLA ANTIBODY, IGM: Rubella: IMMUNE

## 2022-07-14 LAB — OB RESULTS CONSOLE HEPATITIS B SURFACE ANTIGEN: Hepatitis B Surface Ag: NEGATIVE

## 2022-07-14 LAB — HEPATITIS C ANTIBODY: HCV Ab: NEGATIVE

## 2022-07-28 LAB — OB RESULTS CONSOLE GC/CHLAMYDIA
Chlamydia: NEGATIVE
Neisseria Gonorrhea: NEGATIVE

## 2022-11-28 NOTE — L&D Delivery Note (Signed)
Delivery Note At 3:08 PM a viable female was delivered via Vaginal, Spontaneous (Presentation: Left Occiput Anterior).  APGAR: 8, 9; weight  .   Placenta status: Spontaneous, Intact.  Cord: 3 vessels with the following complications:  .  Cord pH: not sent Easy delivery of baby over ~ 6 contractions. Baby cried as was coming out. Vigorous infant.   Anesthesia: Epidural Episiotomy: None Lacerations: 1st degree Suture Repair: 3.0 vicryl Est. Blood Loss (mL): 202   It's a boy!!   Mom to postpartum.  Baby to Couplet care / Skin to Skin.  Colin Benton Ario Mcdiarmid 02/13/2023, 3:24 PM

## 2023-02-13 ENCOUNTER — Other Ambulatory Visit: Payer: Self-pay

## 2023-02-13 ENCOUNTER — Inpatient Hospital Stay (HOSPITAL_COMMUNITY): Payer: BC Managed Care – PPO | Admitting: Anesthesiology

## 2023-02-13 ENCOUNTER — Inpatient Hospital Stay (HOSPITAL_COMMUNITY)
Admission: AD | Admit: 2023-02-13 | Discharge: 2023-02-14 | DRG: 807 | Disposition: A | Discharge status: Home / Self Care | Payer: BC Managed Care – PPO | Attending: Obstetrics and Gynecology | Admitting: Obstetrics and Gynecology

## 2023-02-13 ENCOUNTER — Encounter (HOSPITAL_COMMUNITY): Payer: Self-pay | Admitting: Obstetrics & Gynecology

## 2023-02-13 DIAGNOSIS — Z3A39 39 weeks gestation of pregnancy: Secondary | ICD-10-CM

## 2023-02-13 DIAGNOSIS — O99824 Streptococcus B carrier state complicating childbirth: Principal | ICD-10-CM | POA: Diagnosis present

## 2023-02-13 DIAGNOSIS — Z3490 Encounter for supervision of normal pregnancy, unspecified, unspecified trimester: Principal | ICD-10-CM

## 2023-02-13 DIAGNOSIS — O26893 Other specified pregnancy related conditions, third trimester: Secondary | ICD-10-CM | POA: Diagnosis present

## 2023-02-13 LAB — CBC
HCT: 39.8 % (ref 36.0–46.0)
Hemoglobin: 12.9 g/dL (ref 12.0–15.0)
MCH: 29.5 pg (ref 26.0–34.0)
MCHC: 32.4 g/dL (ref 30.0–36.0)
MCV: 91.1 fL (ref 80.0–100.0)
Platelets: 247 10*3/uL (ref 150–400)
RBC: 4.37 MIL/uL (ref 3.87–5.11)
RDW: 13.4 % (ref 11.5–15.5)
WBC: 16.6 10*3/uL — ABNORMAL HIGH (ref 4.0–10.5)
nRBC: 0 % (ref 0.0–0.2)

## 2023-02-13 LAB — TYPE AND SCREEN
ABO/RH(D): O POS
Antibody Screen: NEGATIVE

## 2023-02-13 LAB — RPR: RPR Ser Ql: NONREACTIVE

## 2023-02-13 LAB — HIV ANTIBODY (ROUTINE TESTING W REFLEX): HIV Screen 4th Generation wRfx: NONREACTIVE

## 2023-02-13 MED ORDER — ONDANSETRON HCL 4 MG/2ML IJ SOLN: 4.0000 mg | INTRAMUSCULAR | Status: DC | PRN

## 2023-02-13 MED ORDER — DIPHENHYDRAMINE HCL 50 MG/ML IJ SOLN: 12.5000 mg | INTRAMUSCULAR | Status: DC | PRN

## 2023-02-13 MED ORDER — LIDOCAINE HCL (PF) 1 % IJ SOLN: 30.0000 mL | INTRAMUSCULAR | Status: DC | PRN

## 2023-02-13 MED ORDER — HYDROXYZINE HCL 50 MG PO TABS: 50.0000 mg | ORAL_TABLET | Freq: Four times a day (QID) | ORAL | Status: DC | PRN

## 2023-02-13 MED ORDER — DIPHENHYDRAMINE HCL 25 MG PO CAPS: 25.0000 mg | ORAL_CAPSULE | Freq: Four times a day (QID) | ORAL | Status: DC | PRN

## 2023-02-13 MED ORDER — OXYCODONE-ACETAMINOPHEN 5-325 MG PO TABS: 1.0000 | ORAL_TABLET | ORAL | Status: DC | PRN

## 2023-02-13 MED ORDER — LIDOCAINE-EPINEPHRINE (PF) 1.5 %-1:200000 IJ SOLN
INTRAMUSCULAR | Status: DC | PRN
  Administered 2023-02-13: 2 mL via EPIDURAL
  Administered 2023-02-13: 3 mL via EPIDURAL

## 2023-02-13 MED ORDER — PHENYLEPHRINE 80 MCG/ML (10ML) SYRINGE FOR IV PUSH (FOR BLOOD PRESSURE SUPPORT): 80.0000 ug | PREFILLED_SYRINGE | INTRAVENOUS | Status: DC | PRN

## 2023-02-13 MED ORDER — COCONUT OIL OIL: 1.0000 | TOPICAL_OIL | Status: DC | PRN

## 2023-02-13 MED ORDER — FENTANYL-BUPIVACAINE-NACL 0.5-0.125-0.9 MG/250ML-% EP SOLN
12.0000 mL/h | EPIDURAL | Status: DC | PRN
  Administered 2023-02-13: 12 mL/h via EPIDURAL
  Filled 2023-02-13: qty 250

## 2023-02-13 MED ORDER — DIBUCAINE (PERIANAL) 1 % EX OINT
1.0000 | TOPICAL_OINTMENT | CUTANEOUS | Status: DC | PRN
  Administered 2023-02-13: 1 via RECTAL
  Filled 2023-02-13: qty 28

## 2023-02-13 MED ORDER — NEBIVOLOL HCL 5 MG PO TABS
5.0000 mg | ORAL_TABLET | Freq: Every day | ORAL | Status: DC
  Administered 2023-02-13 – 2023-02-14 (×2): 5 mg via ORAL
  Filled 2023-02-13 (×2): qty 1

## 2023-02-13 MED ORDER — OXYTOCIN-SODIUM CHLORIDE 30-0.9 UT/500ML-% IV SOLN: 2.5000 [IU]/h | INTRAVENOUS | Status: DC

## 2023-02-13 MED ORDER — BENZOCAINE-MENTHOL 20-0.5 % EX AERO
1.0000 | INHALATION_SPRAY | CUTANEOUS | Status: DC | PRN
  Administered 2023-02-13: 1 via TOPICAL
  Filled 2023-02-13: qty 56

## 2023-02-13 MED ORDER — LACTATED RINGERS IV SOLN: INTRAVENOUS | Status: DC

## 2023-02-13 MED ORDER — ONDANSETRON HCL 4 MG PO TABS: 4.0000 mg | ORAL_TABLET | ORAL | Status: DC | PRN

## 2023-02-13 MED ORDER — OXYCODONE-ACETAMINOPHEN 5-325 MG PO TABS: 2.0000 | ORAL_TABLET | ORAL | Status: DC | PRN

## 2023-02-13 MED ORDER — SOD CITRATE-CITRIC ACID 500-334 MG/5ML PO SOLN
30.0000 mL | ORAL | Status: DC | PRN
  Administered 2023-02-13: 30 mL via ORAL
  Filled 2023-02-13: qty 30

## 2023-02-13 MED ORDER — ACETAMINOPHEN 325 MG PO TABS: 650.0000 mg | ORAL_TABLET | ORAL | Status: DC | PRN

## 2023-02-13 MED ORDER — IBUPROFEN 600 MG PO TABS
600.0000 mg | ORAL_TABLET | Freq: Four times a day (QID) | ORAL | Status: DC
  Administered 2023-02-13 – 2023-02-14 (×4): 600 mg via ORAL
  Filled 2023-02-13 (×4): qty 1

## 2023-02-13 MED ORDER — ZOLPIDEM TARTRATE 5 MG PO TABS: 5.0000 mg | ORAL_TABLET | Freq: Every evening | ORAL | Status: DC | PRN

## 2023-02-13 MED ORDER — SIMETHICONE 80 MG PO CHEW: 80.0000 mg | CHEWABLE_TABLET | ORAL | Status: DC | PRN

## 2023-02-13 MED ORDER — OXYTOCIN-SODIUM CHLORIDE 30-0.9 UT/500ML-% IV SOLN
1.0000 m[IU]/min | INTRAVENOUS | Status: DC
  Administered 2023-02-13: 2 m[IU]/min via INTRAVENOUS
  Filled 2023-02-13: qty 500

## 2023-02-13 MED ORDER — FENTANYL CITRATE (PF) 100 MCG/2ML IJ SOLN: 50.0000 ug | INTRAMUSCULAR | Status: DC | PRN

## 2023-02-13 MED ORDER — LACTATED RINGERS IV SOLN
500.0000 mL | Freq: Once | INTRAVENOUS | Status: AC
  Administered 2023-02-13: 500 mL via INTRAVENOUS

## 2023-02-13 MED ORDER — EPHEDRINE 5 MG/ML INJ: 10.0000 mg | INTRAVENOUS | Status: DC | PRN

## 2023-02-13 MED ORDER — OXYTOCIN BOLUS FROM INFUSION
333.0000 mL | Freq: Once | INTRAVENOUS | Status: AC
  Administered 2023-02-13: 333 mL via INTRAVENOUS

## 2023-02-13 MED ORDER — SODIUM CHLORIDE 0.9 % IV SOLN
5.0000 10*6.[IU] | Freq: Once | INTRAVENOUS | Status: AC
  Administered 2023-02-13: 5 10*6.[IU] via INTRAVENOUS
  Filled 2023-02-13: qty 5

## 2023-02-13 MED ORDER — OXYCODONE HCL 5 MG PO TABS: 10.0000 mg | ORAL_TABLET | ORAL | Status: DC | PRN

## 2023-02-13 MED ORDER — TERBUTALINE SULFATE 1 MG/ML IJ SOLN: 0.2500 mg | Freq: Once | INTRAMUSCULAR | Status: DC | PRN

## 2023-02-13 MED ORDER — TETANUS-DIPHTH-ACELL PERTUSSIS 5-2.5-18.5 LF-MCG/0.5 IM SUSY: 0.5000 mL | PREFILLED_SYRINGE | Freq: Once | INTRAMUSCULAR | Status: DC

## 2023-02-13 MED ORDER — ONDANSETRON HCL 4 MG/2ML IJ SOLN
4.0000 mg | Freq: Four times a day (QID) | INTRAMUSCULAR | Status: DC | PRN
  Administered 2023-02-13: 4 mg via INTRAVENOUS
  Filled 2023-02-13: qty 2

## 2023-02-13 MED ORDER — PENICILLIN G POT IN DEXTROSE 60000 UNIT/ML IV SOLN
3.0000 10*6.[IU] | INTRAVENOUS | Status: DC
  Administered 2023-02-13: 3 10*6.[IU] via INTRAVENOUS
  Filled 2023-02-13 (×2): qty 50

## 2023-02-13 MED ORDER — LACTATED RINGERS IV SOLN
500.0000 mL | INTRAVENOUS | Status: DC | PRN
  Administered 2023-02-13: 1000 mL via INTRAVENOUS

## 2023-02-13 MED ORDER — PHENYLEPHRINE 80 MCG/ML (10ML) SYRINGE FOR IV PUSH (FOR BLOOD PRESSURE SUPPORT)
80.0000 ug | PREFILLED_SYRINGE | INTRAVENOUS | Status: DC | PRN
  Filled 2023-02-13: qty 10

## 2023-02-13 MED ORDER — WITCH HAZEL-GLYCERIN EX PADS: 1.0000 | MEDICATED_PAD | CUTANEOUS | Status: DC | PRN

## 2023-02-13 MED ORDER — OXYCODONE HCL 5 MG PO TABS: 5.0000 mg | ORAL_TABLET | ORAL | Status: DC | PRN

## 2023-02-13 MED ORDER — SENNOSIDES-DOCUSATE SODIUM 8.6-50 MG PO TABS
2.0000 | ORAL_TABLET | ORAL | Status: DC
  Administered 2023-02-14: 2 via ORAL
  Filled 2023-02-13: qty 2

## 2023-02-13 MED ORDER — PRENATAL MULTIVITAMIN CH
1.0000 | ORAL_TABLET | Freq: Every day | ORAL | Status: DC
  Administered 2023-02-14: 1 via ORAL
  Filled 2023-02-13: qty 1

## 2023-02-13 NOTE — Anesthesia Procedure Notes (Addendum)
Epidural Patient location during procedure: OB Start time: 02/13/2023 12:07 PM End time: 02/13/2023 12:29 PM  Staffing Anesthesiologist: Nolon Nations, MD Performed: anesthesiologist   Preanesthetic Checklist Completed: patient identified, IV checked, risks and benefits discussed, monitors and equipment checked, pre-op evaluation and timeout performed  Epidural Patient position: sitting Prep: DuraPrep and site prepped and draped Patient monitoring: heart rate, continuous pulse ox and blood pressure Approach: midline Location: L3-L4 Injection technique: LOR air and LOR saline  Needle:  Needle type: Tuohy  Needle gauge: 17 G Needle length: 9 cm Needle insertion depth: 5 cm Catheter type: closed end flexible Catheter size: 19 Gauge Catheter at skin depth: 11 cm Test dose: negative  Assessment Sensory level: T8 Events: blood not aspirated, no cerebrospinal fluid, injection not painful, no injection resistance, no paresthesia and negative IV test  Additional Notes Reason for block:procedure for pain

## 2023-02-13 NOTE — Progress Notes (Signed)
5/c/-1 Arom clear fluid

## 2023-02-13 NOTE — MAU Note (Signed)
.  Kelly Fischer is a 42 y.o. at 39.3-G2P1 Reporting contractions that began yesterday at 1700-progressed in strength and regular at 2200./ Pt denies SROM, vaginal bleeding or bloody show. Endorses + fetal movement.    Onset of complaint: 1700 Pain score: 7 There were no vitals filed for this visit.   FHT:125bpm Lab orders placed from triage:

## 2023-02-13 NOTE — MAU Provider Note (Signed)
S: Ms. Kelly Fischer is a 42 y.o. G2P1001 at [redacted]w[redacted]d  who presents to MAU today complaining contractions q 5-7 minutes since 2200. She denies vaginal bleeding. She denies LOF. She reports normal fetal movement.    O: BP 119/73 (BP Location: Right Arm)   Pulse 82   Temp 98.3 F (36.8 C) (Oral)   Resp 19   Ht 5\' 6"  (1.676 m)   Wt 76.2 kg   SpO2 100%   BMI 27.12 kg/m  GENERAL: Well-developed, well-nourished female in no acute distress.  HEAD: Normocephalic, atraumatic.  CHEST: Normal effort of breathing, regular heart rate ABDOMEN: Soft, nontender, gravid  Cervical exam:  Dilation: 2.5 Effacement (%): 70 Cervical Position: Posterior Station: -1 Presentation: Vertex Exam by:: Youlanda Roys RN   Fetal Monitoring: Baseline: 125 Variability: moderate Accelerations: 15x15 Decelerations: none Contractions: 4-7  Patient initially changed from 1.5 to 2.5cm. Requested more time for another exam before being discharged home. Third exam unchanged.   Patient very nervous about discharge home. States she went quickly after her water broke last pregnancy and is very nervous about rapid labor and being in pain at home. Requested CNM speak with community OB about plan of care.   Dr. Royston Sinner notified of patient arrival in MAU, exam and concerns. MD states patient can be admitted as long as patient is agreeable to augmentation. CNM discussed with patient plan of care and patient agreeable.   A: [facility] at [redacted]w[redacted]d  Early labor  P: -Admit to labor and delivery -Care turned over to MD  Wende Mott, CNM 02/13/2023 7:25 AM

## 2023-02-13 NOTE — H&P (Addendum)
Kelly Fischer is a 42 y.o. female presenting for SOL. Cervical change from 1 to 2.5cm in MAU. Contracting painfully every 5 minutes. She has a prior uncomplicated SVD of a 6#& infant. She has a history of dysautonomia/POTS c/w bistolic 5mg  q am.  She is GBS positive.   OB History     Gravida  2   Para  1   Term  1   Preterm      AB      Living  1      SAB      IAB      Ectopic      Multiple  0   Live Births  1          Past Medical History:  Diagnosis Date   Abnormal EKG    Prominent QRS voltage, short PR interval, ST-T wave abnormalities.    Dysautonomia ([facility]) since jan 2014   bp increases   GERD (gastroesophageal reflux disease)    PVC (premature ventricular contraction)    Raynaud disease 1995   Raynaud's disease    Tachycardia    Past Surgical History:  Procedure Laterality Date   EUS N/A 03/21/2013   Procedure: UPPER ENDOSCOPIC ULTRASOUND (EUS) LINEAR;  Surgeon: Rachael Fee, MD;  Location: [facility];  Service: Endoscopy;  Laterality: N/A;   WISDOM TOOTH EXTRACTION  2002   Family History: family history includes Colon polyps in her mother; Heart disease in her mother; Lupus in her maternal grandmother. Social History:  reports that she has never smoked. She has never used smokeless tobacco. She reports that she does not currently use alcohol. She reports that she does not use drugs.     Maternal Diabetes: No Genetic Screening: Normal Maternal Ultrasounds/Referrals: Normal Fetal Ultrasounds or other Referrals:  None Maternal Substance Abuse:  No Significant Maternal Medications:  None Significant Maternal Lab Results:  Group B Strep positive Number of Prenatal Visits:greater than 3 verified prenatal visits Other Comments:  None  Review of Systems History Dilation: 2.5 Effacement (%): 70 Station: -1 Exam by:: Santiago Bur RN Blood pressure 119/73, pulse 82, temperature 98.3 F (36.8 C), temperature source Oral, resp. rate 19,  height 5\' 6"  (1.676 m), weight 76.2 kg, SpO2 99 %. Exam Physical Exam  NAD, A&O NWOB Abd soft, nondistended, gravid  Prenatal labs: ABO, Rh:   Antibody:   Rubella:   RPR:    HBsAg:    HIV:    GBS:   positive  Assessment/Plan: 42 yo G2P1001 presenting with SOL. Hx SVD. AROM after PCN started. PCN per protocol.  Continue home bistolic.    Ranae Pila 02/13/2023, 8:23 AM

## 2023-02-13 NOTE — Anesthesia Preprocedure Evaluation (Signed)
Anesthesia Evaluation  Patient identified by MRN, date of birth, ID band Patient awake    Reviewed: Allergy & Precautions, H&P , Patient's Chart, lab work & pertinent test results  History of Anesthesia Complications Negative for: history of anesthetic complications  Airway Mallampati: II  TM Distance: >3 FB Neck ROM: full    Dental no notable dental hx.    Pulmonary neg pulmonary ROS   Pulmonary exam normal        Cardiovascular Normal cardiovascular exam Rhythm:regular Rate:Normal  H/o dysautonomia w/ tachycardia/HTN, well controlled with bystolic   Neuro/Psych negative neurological ROS  negative psych ROS   GI/Hepatic Neg liver ROS,GERD  ,,  Endo/Other  negative endocrine ROS    Renal/GU negative Renal ROS  negative genitourinary   Musculoskeletal   Abdominal   Peds  Hematology negative hematology ROS (+)   Anesthesia Other Findings  Raynaud's  Reproductive/Obstetrics (+) Pregnancy                             Anesthesia Physical Anesthesia Plan  ASA: 2  Anesthesia Plan: Epidural   Post-op Pain Management:    Induction:   PONV Risk Score and Plan:   Airway Management Planned:   Additional Equipment:   Intra-op Plan:   Post-operative Plan:   Informed Consent: I have reviewed the patients History and Physical, chart, labs and discussed the procedure including the risks, benefits and alternatives for the proposed anesthesia with the patient or authorized representative who has indicated his/her understanding and acceptance.       Plan Discussed with:   Anesthesia Plan Comments:         Anesthesia Quick Evaluation

## 2023-02-13 NOTE — Progress Notes (Signed)
Comf with cle.  7/c/0 Cat 2 tracing for recurrent short variables.  Ctm closely overall reassuring.

## 2023-02-14 LAB — CBC
HCT: 33 % — ABNORMAL LOW (ref 36.0–46.0)
Hemoglobin: 11.3 g/dL — ABNORMAL LOW (ref 12.0–15.0)
MCH: 30.5 pg (ref 26.0–34.0)
MCHC: 34.2 g/dL (ref 30.0–36.0)
MCV: 89.2 fL (ref 80.0–100.0)
Platelets: 187 10*3/uL (ref 150–400)
RBC: 3.7 MIL/uL — ABNORMAL LOW (ref 3.87–5.11)
RDW: 13.4 % (ref 11.5–15.5)
WBC: 13.8 10*3/uL — ABNORMAL HIGH (ref 4.0–10.5)
nRBC: 0 % (ref 0.0–0.2)

## 2023-02-14 MED ORDER — ACETAMINOPHEN 325 MG PO TABS: 650.0000 mg | ORAL_TABLET | Freq: Four times a day (QID) | ORAL | 0 refills | Status: AC | PRN

## 2023-02-14 NOTE — Progress Notes (Signed)
Discharge information given to patient, all questions and concerns addressed, and patient verbalized understanding. Patient alert and oriented x4, ambulatory, and VS and pain stable.

## 2023-02-14 NOTE — Lactation Note (Signed)
This note was copied from a baby's chart. Lactation Consultation Note  Patient Name: Boy Lamecia Gajjar [id number] Date: 02/14/2023 Age:40 hours Reason for consult: Initial assessment  Baby recently breastfed for 30 min.  Mother states she had an overproduction with first child. Mother pumping with hand pump when LC entered room.  She pumped x 2 combined was 30 ml.  Encouraged mother to offer both breasts per feeding and at this time it is not necessary to pump the other breast if baby is not latching on both.  Mother will prepump to help latch because she states her nipple on L was inverted and has had difficulty latching.  Reviewed milk storage and suggest mother call for next feeding. Feed on demand with cues.  Goal 8-12+ times per day after first 24 hrs.  Place baby STS if not cueing.    Maternal Data Has patient been taught Hand Expression?: Yes Does the patient have breastfeeding experience prior to this delivery?: Yes How long did the patient breastfeed?: one year  Feeding Mother's Current Feeding Choice: Breast Milk Lactation Tools Discussed/Used Tools: Pump Breast pump type: Manual Pump Education: Milk Storage  Interventions Interventions: Education;LC Services brochure;Hand pump Consult Status Consult Status: Follow-up Date: 02/15/23 Follow-up type: In-patient    Vivianne Master [facility] 02/14/2023, 10:58 AM

## 2023-02-14 NOTE — Progress Notes (Signed)
Post Partum Day 1 Subjective: no complaints, up ad lib, voiding, tolerating PO, and + flatus  Objective: Blood pressure 98/74, pulse 76, temperature 98.3 F (36.8 C), temperature source Oral, resp. rate 18, height 5\' 6"  (1.676 m), weight 76.2 kg, SpO2 100 %, unknown if currently breastfeeding.  Physical Exam:  General: alert, cooperative, and no distress Lochia: appropriate Uterine Fundus: firm Incision: healing well DVT Evaluation: No evidence of DVT seen on physical exam.  Recent Labs    02/13/23 0820 02/14/23 0542  HGB 12.9 11.3*  HCT 39.8 33.0*    Assessment/Plan: Discharge home Wants to go home if baby cleared later today for discharge D/W discharge instructions  LOS: 1 day   Peterson, MD 02/14/2023, 7:30 AM

## 2023-02-14 NOTE — Discharge Summary (Signed)
Postpartum Discharge Summary  Date of Service updated 02/14/23     Patient Name: Kelly Fischer DOB: [birth year] MRN: [id number]  Date of admission: 02/13/2023 Delivery date:02/13/2023  Delivering provider: Tyson Dense  Date of discharge: 02/14/2023  Admitting diagnosis: Pregnancy [Z34.90] Intrauterine pregnancy: [redacted]w[redacted]d     Secondary diagnosis:  Principal Problem:   Pregnancy  Additional problems:     Discharge diagnosis: Term Pregnancy Delivered                                              Post partum procedures: Augmentation: AROM and Pitocin Complications: None  Hospital course: Onset of Labor With Vaginal Delivery      42 y.o. yo G2P2002 at [redacted]w[redacted]d was admitted in Active Labor on 02/13/2023. Labor course was complicated by  Membrane Rupture Time/Date: 12:07 PM ,02/13/2023   Delivery Method:Vaginal, Spontaneous  Episiotomy: None  Lacerations:  1st degree  Patient had a postpartum course complicated by .  She is ambulating, tolerating a regular diet, passing flatus, and urinating well. Patient is discharged home in stable condition on 02/14/23.  Newborn Data: Birth date:02/13/2023  Birth time:3:08 PM  Gender:Female  Living status:Living  Apgars:8 ,9  Weight:3062 g   Magnesium Sulfate received: No BMZ received: No Rhophylac:No MMR:No T-DaP:Given prenatally Flu: No Transfusion:No  Physical exam  Vitals:   02/13/23 1838 02/13/23 2230 02/14/23 0245 02/14/23 0646  BP: 97/62 112/68 108/61 98/74  Pulse: 72 73 68 76  Resp: 17 18 18 18   Temp: 98.4 F (36.9 C) 98 F (36.7 C) 98.2 F (36.8 C) 98.3 F (36.8 C)  TempSrc: Oral Oral Oral Oral  SpO2:  100% 100% 100%  Weight:      Height:       General: alert, cooperative, and no distress Lochia: appropriate Uterine Fundus: firm Incision: Healing well with no significant drainage DVT Evaluation: No evidence of DVT seen on physical exam. Labs: Lab Results  Component Value Date   WBC 13.8 (H) 02/14/2023    HGB 11.3 (L) 02/14/2023   HCT 33.0 (L) 02/14/2023   MCV 89.2 02/14/2023   PLT 187 02/14/2023      Latest Ref Rng & Units 02/05/2013   11:40 AM  CMP  Glucose 70 - 99 mg/dL 89   BUN 6 - 23 mg/dL 10   Creatinine 0.4 - 1.2 mg/dL 0.7   Sodium 135 - 145 mEq/L 137   Potassium 3.5 - 5.1 mEq/L 4.3   Chloride 96 - 112 mEq/L 103   CO2 19 - 32 mEq/L 29   Calcium 8.4 - 10.5 mg/dL 9.3   Total Protein 6.0 - 8.3 g/dL 6.8   Total Bilirubin 0.3 - 1.2 mg/dL 0.6   Alkaline Phos 39 - 117 U/L 38   AST 0 - 37 U/L 24   ALT 0 - 35 U/L 32    Edinburgh Score:    02/13/2023    5:45 PM  Edinburgh Postnatal Depression Scale Screening Tool  I have been able to laugh and see the funny side of things. 0  I have looked forward with enjoyment to things. 0  I have blamed myself unnecessarily when things went wrong. 0  I have been anxious or worried for no good reason. 0  I have felt scared or panicky for no good reason. 0  Things have been getting on top  of me. 0  I have been so unhappy that I have had difficulty sleeping. 0  I have felt sad or miserable. 0  I have been so unhappy that I have been crying. 0  The thought of harming myself has occurred to me. 0  Edinburgh Postnatal Depression Scale Total 0      After visit meds:  Allergies as of 02/14/2023       Reactions   Azithromycin Rash, Hives        Medication List     STOP taking these medications    amoxicillin-clavulanate 875-125 MG tablet Commonly known as: AUGMENTIN   CALCIUM-MAGNESIUM-VITAMIN D PO   multivitamin with minerals Tabs tablet       TAKE these medications    acetaminophen 325 MG tablet Commonly known as: Tylenol Take 2 tablets (650 mg total) by mouth every 6 (six) hours as needed (for pain scale < 4).   ibuprofen 600 MG tablet Commonly known as: ADVIL Take 1 tablet (600 mg total) by mouth every 6 (six) hours.   nebivolol 5 MG tablet Commonly known as: Bystolic Take 0.5 tablets (2.5 mg total) by mouth in  the morning and at bedtime.   prenatal vitamin w/FE, FA 27-1 MG Tabs tablet Take 1 tablet by mouth daily at 12 noon.   sodium chloride 0.65 % Soln nasal spray Commonly known as: OCEAN Place 1 spray into both nostrils as needed for congestion.         Discharge home in stable condition Infant Feeding: Breast Infant Disposition:home with mother Discharge instruction: per After Visit Summary and Postpartum booklet. Activity: Advance as tolerated. Pelvic rest for 6 weeks.  Diet: routine diet Anticipated Birth Control: Unsure Postpartum Appointment:6 weeks Additional Postpartum F/U:  Future Appointments:No future appointments. Follow up Visit:      02/14/2023 Allena Katz, MD

## 2023-02-14 NOTE — Anesthesia Postprocedure Evaluation (Signed)
Anesthesia Post Note  Patient: Kelly Fischer  Procedure(s) Performed: AN AD HOC LABOR EPIDURAL     Patient location during evaluation: Mother Baby Anesthesia Type: Epidural Level of consciousness: awake, oriented and awake and alert Pain management: pain level controlled Vital Signs Assessment: post-procedure vital signs reviewed and stable Respiratory status: spontaneous breathing, respiratory function stable and nonlabored ventilation Cardiovascular status: stable Postop Assessment: no headache, adequate PO intake, able to ambulate, patient able to bend at knees and no apparent nausea or vomiting Anesthetic complications: no   No notable events documented.  Last Vitals:  Vitals:   02/14/23 0245 02/14/23 0646  BP: 108/61 98/74  Pulse: 68 76  Resp: 18 18  Temp: 36.8 C 36.8 C  SpO2: 100% 100%    Last Pain:  Vitals:   02/14/23 0723  TempSrc:   PainSc: 0-No pain   Pain Goal:                   [name]

## 2023-02-14 NOTE — Plan of Care (Signed)

## 2023-02-14 NOTE — Lactation Note (Signed)
This note was copied from a baby's chart. Lactation Consultation Note  Patient Name: Kelly Fischer [id number] Date: 02/14/2023 Age:40 hours Reason for consult: Follow-up assessment  P2, Had mother prepump on L side to help evert nipple and assisted with latching. Nipple inverts when baby latches.  Baby latched for 5 min and fell asleep. Mother will post pump and to stimulate supply if he continue to have difficulty on  L side.  Reviewed engorgement care and monitoring voids/stools.   Maternal Data Has patient been taught Hand Expression?: Yes Does the patient have breastfeeding experience prior to this delivery?: Yes How long did the patient breastfeed?: one year  Feeding Mother's Current Feeding Choice: Breast Milk  LATCH Score Latch: Repeated attempts needed to sustain latch, nipple held in mouth throughout feeding, stimulation needed to elicit sucking reflex.  Audible Swallowing: A few with stimulation  Type of Nipple: Everted at rest and after stimulation  Comfort (Breast/Nipple): Soft / non-tender  Hold (Positioning): No assistance needed to correctly position infant at breast.  LATCH Score: 8   Lactation Tools Discussed/Used Tools: Pump Breast pump type: Manual Pump Education: Milk Storage  Interventions Interventions: Breast feeding basics reviewed;Assisted with latch;Hand express;Education  Discharge Discharge Education: Engorgement and breast care;Warning signs for feeding baby Pump: Personal;DEBP  Consult Status Consult Status: Complete Date: 02/14/23 Follow-up type: In-patient    Vivianne Master [facility] 02/14/2023, 1:56 PM

## 2023-02-17 ENCOUNTER — Inpatient Hospital Stay (HOSPITAL_COMMUNITY)
Admission: RE | Admit: 2023-02-17 | Payer: BC Managed Care – PPO | Source: Home / Self Care | Admitting: Obstetrics and Gynecology

## 2023-02-22 ENCOUNTER — Telehealth (HOSPITAL_COMMUNITY): Payer: Self-pay

## 2023-02-22 NOTE — Telephone Encounter (Signed)
Preadmission testing

## 2023-02-24 ENCOUNTER — Telehealth (HOSPITAL_COMMUNITY): Payer: Self-pay

## 2023-02-24 ENCOUNTER — Telehealth (HOSPITAL_COMMUNITY): Payer: Self-pay | Admitting: *Deleted

## 2023-02-24 NOTE — Telephone Encounter (Signed)
Mom reports feeling good. No concerns about herself at this time. EPDS not completed as mom reports feeling well emotionally. [facility] score=0) Mom reports baby is doing well. Feeding, peeing, and pooping without difficulty. Safe sleep reviewed. Mom reports no concerns about baby at present.  Odis Hollingshead, RN 02-24-2023 at 2;06pm

## 2023-02-24 NOTE — Telephone Encounter (Signed)
Chart review.

## 2023-03-10 ENCOUNTER — Ambulatory Visit (HOSPITAL_COMMUNITY): Payer: Self-pay

## 2023-03-10 NOTE — Lactation Note (Signed)
This note was copied from a baby's chart. Lactation Consultation Note  Patient Name: Kelly Fischer [id number] Date: 03/10/2023 Age:40 wk.o.   Spoke  w/RN. He will ask mom if she would like to see Lactation. RN stated the baby is BF.  Maternal Data    Feeding    LATCH Score                    Lactation Tools Discussed/Used    Interventions    Discharge    Consult Status      Charyl Dancer 03/10/2023, 7:53 PM

## 2023-10-13 ENCOUNTER — Other Ambulatory Visit: Payer: Self-pay | Admitting: Obstetrics and Gynecology

## 2023-10-13 DIAGNOSIS — Z1231 Encounter for screening mammogram for malignant neoplasm of breast: Secondary | ICD-10-CM

## 2023-12-06 ENCOUNTER — Ambulatory Visit
Admission: RE | Admit: 2023-12-06 | Discharge: 2023-12-06 | Disposition: A | Discharge status: Home / Self Care | Payer: 59 | Source: Ambulatory Visit | Attending: Obstetrics and Gynecology | Admitting: Obstetrics and Gynecology

## 2023-12-06 ENCOUNTER — Encounter: Payer: Self-pay | Admitting: Radiology

## 2023-12-06 DIAGNOSIS — Z1231 Encounter for screening mammogram for malignant neoplasm of breast: Secondary | ICD-10-CM

## 2024-10-11 ENCOUNTER — Other Ambulatory Visit: Payer: Self-pay | Admitting: Obstetrics and Gynecology

## 2024-10-11 DIAGNOSIS — Z1231 Encounter for screening mammogram for malignant neoplasm of breast: Secondary | ICD-10-CM

## 2024-12-09 ENCOUNTER — Ambulatory Visit
Admission: RE | Admit: 2024-12-09 | Discharge: 2024-12-09 | Disposition: A | Source: Ambulatory Visit | Attending: Obstetrics and Gynecology | Admitting: Obstetrics and Gynecology

## 2024-12-09 DIAGNOSIS — Z1231 Encounter for screening mammogram for malignant neoplasm of breast: Secondary | ICD-10-CM
